# Patient Record
Sex: Male | Born: 1970 | Race: Black or African American | Hispanic: No | Marital: Single | Smoking: Never smoker
Health system: Southern US, Community
[De-identification: ages and names within clinical notes are randomized; demographics above are authoritative.]

## PROBLEM LIST (undated history)

## (undated) DIAGNOSIS — I10 Essential (primary) hypertension: Secondary | ICD-10-CM

---

## 2005-07-02 ENCOUNTER — Emergency Department: Payer: Self-pay | Admitting: Unknown Physician Specialty

## 2010-04-28 ENCOUNTER — Emergency Department: Payer: Self-pay | Admitting: Emergency Medicine

## 2011-07-12 ENCOUNTER — Emergency Department: Payer: Self-pay | Admitting: Emergency Medicine

## 2012-10-31 ENCOUNTER — Emergency Department: Payer: Self-pay | Admitting: Unknown Physician Specialty

## 2017-12-04 ENCOUNTER — Encounter: Payer: Self-pay | Admitting: Emergency Medicine

## 2017-12-04 ENCOUNTER — Emergency Department
Admission: EM | Admit: 2017-12-04 | Discharge: 2017-12-04 | Disposition: A | Discharge status: Home / Self Care | Payer: BLUE CROSS/BLUE SHIELD | Attending: Student in an Organized Health Care Education/Training Program | Admitting: Student in an Organized Health Care Education/Training Program

## 2017-12-04 ENCOUNTER — Other Ambulatory Visit: Payer: Self-pay

## 2017-12-04 DIAGNOSIS — A749 Chlamydial infection, unspecified: Secondary | ICD-10-CM

## 2017-12-04 DIAGNOSIS — Z202 Contact with and (suspected) exposure to infections with a predominantly sexual mode of transmission: Secondary | ICD-10-CM | POA: Diagnosis present

## 2017-12-04 LAB — URINALYSIS, COMPLETE (UACMP) WITH MICROSCOPIC
Bacteria, UA: NONE SEEN
Bilirubin Urine: NEGATIVE
Glucose, UA: NEGATIVE mg/dL
Hgb urine dipstick: NEGATIVE
KETONES UR: NEGATIVE mg/dL
LEUKOCYTES UA: NEGATIVE
Nitrite: NEGATIVE
Protein, ur: NEGATIVE mg/dL
SPECIFIC GRAVITY, URINE: 1.011 (ref 1.005–1.030)
Squamous Epithelial / LPF: NONE SEEN (ref 0–5)
pH: 6 (ref 5.0–8.0)

## 2017-12-04 LAB — CHLAMYDIA/NGC RT PCR (ARMC ONLY)
Chlamydia Tr: DETECTED — AB
N gonorrhoeae: NOT DETECTED

## 2017-12-04 MED ORDER — CEFTRIAXONE SODIUM 250 MG IJ SOLR
250.0000 mg | Freq: Once | INTRAMUSCULAR | Status: AC
  Administered 2017-12-04: 250 mg via INTRAMUSCULAR
  Filled 2017-12-04: qty 250

## 2017-12-04 MED ORDER — AZITHROMYCIN 500 MG PO TABS
1000.0000 mg | ORAL_TABLET | Freq: Once | ORAL | Status: AC
  Administered 2017-12-04: 1000 mg via ORAL
  Filled 2017-12-04: qty 2

## 2017-12-04 MED ORDER — LIDOCAINE HCL (PF) 1 % IJ SOLN
5.0000 mL | Freq: Once | INTRAMUSCULAR | Status: AC
  Administered 2017-12-04: 5 mL
  Filled 2017-12-04: qty 5

## 2017-12-04 NOTE — Discharge Instructions (Addendum)
Advised to notify sexual contact of chlamydia infection.

## 2017-12-04 NOTE — ED Notes (Signed)
See triage note  Presents with possible STD  States he noticed some penile discharge couple of days ago  Denies any other sx's

## 2017-12-04 NOTE — ED Triage Notes (Signed)
Patient ambulatory to triage with steady gait, without difficulty or distress noted; pt reports possible STD

## 2017-12-04 NOTE — ED Provider Notes (Signed)
[facility] Emergency Department Provider Note   ____________________________________________   First MD Initiated Contact with Patient 12/04/17 0710     (approximate)  I have reviewed the triage vital signs and the nursing notes.   HISTORY  Chief Complaint Exposure to STD    HPI Aaron Richard is a 47 y.o. male versus concern for STD exposure.  Patient denies any symptoms.  Patient last sexual contact was 1 week ago.   History reviewed. No pertinent past medical history.  There are no active problems to display for this patient.   History reviewed. No pertinent surgical history.  Prior to Admission medications   Not on File    Allergies Patient has no known allergies.  No family history on file.  Social History Social History   Tobacco Use  . Smoking status: Never Smoker  . Smokeless tobacco: Never Used  Substance Use Topics  . Alcohol use: Not on file  . Drug use: Not on file    Review of Systems Constitutional: No fever/chills Eyes: No visual changes. ENT: No sore throat. Cardiovascular: Denies chest pain. Respiratory: Denies shortness of breath. Gastrointestinal: No abdominal pain.  No nausea, no vomiting.  No diarrhea.  No constipation. Genitourinary: Negative for dysuria. Musculoskeletal: Negative for back pain. Skin: Negative for rash. Neurological: Negative for headaches, focal weakness or numbness.   ____________________________________________   PHYSICAL EXAM:  VITAL SIGNS: ED Triage Vitals  Enc Vitals Group     BP 12/04/17 0541 (!) 148/102     Pulse Rate 12/04/17 0541 (!) 102     Resp 12/04/17 0541 18     Temp 12/04/17 0541 98.1 F (36.7 C)     Temp Source 12/04/17 0541 Oral     SpO2 12/04/17 0541 98 %     Weight 12/04/17 0534 216 lb (98 kg)     Height 12/04/17 0534  (1.676 m)     Head Circumference --      Peak Flow --      Pain Score 12/04/17 0532 0     Pain Loc --      Pain Edu? --    Excl. in GC? --     Constitutional: Alert and oriented. Well appearing and in no acute distress. Cardiovascular: Normal rate, regular rhythm. Grossly normal heart sounds.  Good peripheral circulation. Respiratory: Normal respiratory effort.  No retractions. Lungs CTAB. Gastrointestinal: Soft and nontender. No distention. No abdominal bruits. No CVA tenderness. Genitourinary: Positive urethral discharge or lesions. Skin:  Skin is warm, dry and intact. No rash noted. Psychiatric: Mood and affect are normal. Speech and behavior are normal.  ____________________________________________   LABS (all labs ordered are listed, but only abnormal results are displayed)  Labs Reviewed  CHLAMYDIA/NGC RT PCR ([facility] ONLY) - Abnormal; Notable for the following components:      Result Value   Chlamydia Tr DETECTED (*)    All other components within normal limits  URINALYSIS, COMPLETE (UACMP) WITH MICROSCOPIC - Abnormal; Notable for the following components:   Color, Urine STRAW (*)    APPearance CLEAR (*)    All other components within normal limits   ____________________________________________  EKG   ____________________________________________  RADIOLOGY  ED MD interpretation:    Official radiology report(s): No results found.  ____________________________________________   PROCEDURES  Procedure(s) performed:   Procedures  Critical Care performed:   ____________________________________________   INITIAL IMPRESSION / ASSESSMENT AND PLAN / ED COURSE  As part of my medical decision making,  I reviewed the following data within the electronic MEDICAL RECORD NUMBER    Patient was positive for chlamydia.  Patient was treated prior to departure with Zithromax and Rocephin.  Patient advised to follow-up with the [facility] department and notify sexual partner of diagnosis.      ____________________________________________   FINAL CLINICAL IMPRESSION(S) /  ED DIAGNOSES  Final diagnoses:  Chlamydia infection     ED Discharge Orders    None       Note:  This document was prepared using Dragon voice recognition software and may include unintentional dictation errors.    Joni Reining, PA-C 12/04/17 0756    Pershing Proud Myra Rude, MD 12/10/17 2112

## 2018-07-14 ENCOUNTER — Encounter: Payer: Self-pay | Admitting: Emergency Medicine

## 2018-07-14 ENCOUNTER — Emergency Department
Admission: EM | Admit: 2018-07-14 | Discharge: 2018-07-14 | Disposition: A | Discharge status: Home / Self Care | Payer: BLUE CROSS/BLUE SHIELD | Attending: Emergency Medicine | Admitting: Emergency Medicine

## 2018-07-14 ENCOUNTER — Emergency Department: Payer: BLUE CROSS/BLUE SHIELD

## 2018-07-14 ENCOUNTER — Other Ambulatory Visit: Payer: Self-pay

## 2018-07-14 DIAGNOSIS — I10 Essential (primary) hypertension: Secondary | ICD-10-CM | POA: Diagnosis not present

## 2018-07-14 DIAGNOSIS — M25531 Pain in right wrist: Secondary | ICD-10-CM | POA: Diagnosis present

## 2018-07-14 DIAGNOSIS — M778 Other enthesopathies, not elsewhere classified: Secondary | ICD-10-CM | POA: Diagnosis not present

## 2018-07-14 HISTORY — DX: Essential (primary) hypertension: I10

## 2018-07-14 MED ORDER — PREDNISONE 10 MG PO TABS: ORAL_TABLET | ORAL | 0 refills | Status: DC

## 2018-07-14 MED ORDER — KETOROLAC TROMETHAMINE 30 MG/ML IJ SOLN
30.0000 mg | Freq: Once | INTRAMUSCULAR | Status: AC
  Administered 2018-07-14: 30 mg via INTRAMUSCULAR
  Filled 2018-07-14: qty 1

## 2018-07-14 NOTE — Discharge Instructions (Addendum)
Follow-up with your primary care provider or Dr. Joice Lofts who is on-call for orthopedics if any continued problems or not improving.  Begin taking the prednisone tonight as directed tapering down 1 tablet every 2 days.  This medication will not cause any drowsiness and can be taken while you are driving.  You may apply ice to your wrist as needed.  Wear the wrist splint for added support and protection. Also take your blood pressure medication every day as it was elevated in the emergency department.  Your initial blood pressure was 185/109.

## 2018-07-14 NOTE — ED Triage Notes (Signed)
Pt to ED via POV c/o right wrist pain. Pt states that he has had pain since Friday. Denies any known injury. Pt is in NAD at this time.

## 2018-07-14 NOTE — ED Provider Notes (Signed)
[facility] Emergency Department Provider Note   ____________________________________________   First MD Initiated Contact with Patient 07/14/18 1407     (approximate)  I have reviewed the triage vital signs and the nursing notes.   HISTORY  Chief Complaint Wrist Pain   HPI Aaron Richard is a 48 y.o. male presents to the ED with complaint of right wrist pain.  Patient states it began several days ago.  He denies any injury to his wrist.  He states that he is taken ibuprofen which helped.  He states that he range of motion of his wrist increases his pain.  He denies any past wrist fractures.  Patient is right-handed.  Currently rates his pain as 9 out of 10.  Past Medical History:  Diagnosis Date  . Hypertension     There are no active problems to display for this patient.   History reviewed. No pertinent surgical history.  Prior to Admission medications   Medication Sig Start Date End Date Taking? Authorizing Provider  predniSONE (DELTASONE) 10 MG tablet Take 4 tabs x 2 days, 3 tabs x 2 days, 2 tabs x 2 days and 1 tab x 2 days 07/14/18   Tommi Rumps, PA-C    Allergies Patient has no known allergies.  No family history on file.  Social History Social History   Tobacco Use  . Smoking status: Never Smoker  . Smokeless tobacco: Never Used  Substance Use Topics  . Alcohol use: Yes  . Drug use: Not Currently    Review of Systems Constitutional: No fever/chills Cardiovascular: Denies chest pain. Respiratory: Denies shortness of breath. Gastrointestinal: No abdominal pain.  No nausea, no vomiting.  Musculoskeletal: Positive for right wrist pain. Skin: Negative for rash. Neurological: Negative for headaches, focal weakness or numbness. ___________________________________________   PHYSICAL EXAM:  VITAL SIGNS: ED Triage Vitals  Enc Vitals Group     BP 07/14/18 1305 (!) 185/109     Pulse Rate 07/14/18 1305 86     Resp 07/14/18  1305 16     Temp 07/14/18 1305 97.8 F (36.6 C)     Temp Source 07/14/18 1305 Oral     SpO2 07/14/18 1305 100 %     Weight 07/14/18 1304 216 lb (98 kg)     Height 07/14/18 1304 5\' 7"  (1.702 m)     Head Circumference --      Peak Flow --      Pain Score 07/14/18 1304 9     Pain Loc --      Pain Edu? --      Excl. in GC? --    Constitutional: Alert and oriented. Well appearing and in no acute distress. Eyes: Conjunctivae are normal.  Head: Atraumatic. Neck: No stridor.   Cardiovascular: Normal rate, regular rhythm. Grossly normal heart sounds.  Good peripheral circulation. Respiratory: Normal respiratory effort.  No retractions. Lungs CTAB. Musculoskeletal: Right wrist without deformity.   Patient is able to flex and extend without restriction.  There is moderate tenderness on palpation of both the distal radius and ulnar.  No soft tissue swelling is appreciated.  No abrasions or discoloration is seen.  Pulses present.  Motor sensory function intact distally and capillary refills less than 3 seconds.  No point tenderness is on palpation of the lateral epicondyle.  Pain is increased with patient making a fist. Neurologic:  Normal speech and language. No gross focal neurologic deficits are appreciated. No gait instability. Skin:  Skin is warm,  dry and intact. No rash noted. Psychiatric: Mood and affect are normal. Speech and behavior are normal.  ____________________________________________   LABS (all labs ordered are listed, but only abnormal results are displayed)  Labs Reviewed - No data to display  RADIOLOGY   Official radiology report(s): Dg Wrist Complete Right  Result Date: 07/14/2018 CLINICAL DATA:  Pain and swelling over the last 3 days. No known injury. EXAM: RIGHT WRIST - COMPLETE 3+ VIEW COMPARISON:  None. FINDINGS: There is no evidence of fracture or dislocation. There is no evidence of arthropathy or other focal bone abnormality. Soft tissues are unremarkable.  IMPRESSION: Negative. Electronically Signed   By: Paulina Fusi M.D.   On: 07/14/2018 14:26    ____________________________________________   PROCEDURES  Procedure(s) performed: Prefabricated cock-up wrist splint was applied by the tech to the right wrist.  Procedures  Critical Care performed: No  ____________________________________________   INITIAL IMPRESSION / ASSESSMENT AND PLAN / ED COURSE  As part of my medical decision making, I reviewed the following data within the electronic MEDICAL RECORD NUMBER Notes from prior ED visits and  Controlled Substance Database  48 year old male presents to the ED with complaint of right wrist pain for several days without history of injury.  He has taken ibuprofen with short duration of pain improvement.  X-ray was negative.  Physical exam is more suspicious for a tendinitis of the right wrist.  Patient was placed in a cock-up wrist splint.  He is also given Toradol 30 mg IM.  His blood pressure was rechecked and he states that he has not taken his blood pressure medicine and several days.  He is encouraged to take it daily.  He was discharged with prescription for prednisone tapering dose.  He is to follow-up with his PCP or Dr. Joice Lofts if any continued problems with his wrist.  ____________________________________________   FINAL CLINICAL IMPRESSION(S) / ED DIAGNOSES  Final diagnoses:  Right wrist tendonitis     ED Discharge Orders         Ordered    predniSONE (DELTASONE) 10 MG tablet     07/14/18 1509           Note:  This document was prepared using Dragon voice recognition software and may include unintentional dictation errors.    Tommi Rumps, PA-C 07/14/18 1604    Minna Antis, MD 07/15/18 1428

## 2019-08-15 ENCOUNTER — Ambulatory Visit (INDEPENDENT_AMBULATORY_CARE_PROVIDER_SITE_OTHER): Payer: BC Managed Care – PPO | Admitting: Urology

## 2019-08-15 ENCOUNTER — Other Ambulatory Visit: Payer: Self-pay

## 2019-08-15 ENCOUNTER — Encounter: Payer: Self-pay | Admitting: Urology

## 2019-08-15 VITALS — BP 172/106 | HR 90 | Wt 209.9 lb

## 2019-08-15 DIAGNOSIS — Z3009 Encounter for other general counseling and advice on contraception: Secondary | ICD-10-CM

## 2019-08-15 MED ORDER — DIAZEPAM 10 MG PO TABS: 10.0000 mg | ORAL_TABLET | Freq: Once | ORAL | 0 refills | Status: AC

## 2019-08-15 NOTE — Progress Notes (Signed)
08/15/2019 10:03 AM   Mali L Vold 05-02-1971 [id number]  Referring provider: No referring provider defined for this encounter.  Chief Complaint  Patient presents with  . VAS Consult    HPI:  Mali is considering vasectomy. He is single. Doesn't want to have anymore. His kids are 50 yo, 60 yo and 20 yo. He has grand kids. He has considered it for a few years. He has tried condoms. Discussed vasectomy will not prevent STI. No inguinal or testicle surgery. He does [employer].     PMH: Past Medical History:  Diagnosis Date  . Hypertension     Surgical History: No past surgical history on file.  Home Medications:  Allergies as of 08/15/2019   No Known Allergies     Medication List       Accurate as of August 15, 2019 10:03 AM. If you have any questions, ask your nurse or doctor.        STOP taking these medications   predniSONE 10 MG tablet Commonly known as: DELTASONE Stopped by: Festus Aloe, MD       Allergies: No Known Allergies  Family History: No family history on file.  Social History:  reports that he has never smoked. He has never used smokeless tobacco. He reports current alcohol use. He reports previous drug use.  ROS: UROLOGY Frequent Urination?: No Hard to postpone urination?: No Burning/pain with urination?: No Get up at night to urinate?: No Leakage of urine?: No Urine stream starts and stops?: No Trouble starting stream?: No Do you have to strain to urinate?: No Blood in urine?: No Urinary tract infection?: No Sexually transmitted disease?: No Injury to kidneys or bladder?: No Painful intercourse?: No Weak stream?: No Erection problems?: No Penile pain?: No  Gastrointestinal Nausea?: No Vomiting?: No Indigestion/heartburn?: No Diarrhea?: No Constipation?: No  Constitutional Fever: No Night sweats?: No Weight loss?: No Fatigue?: No  Skin Skin rash/lesions?: No Itching?: No  Eyes Blurred vision?: No Double  vision?: No  Ears/Nose/Throat Sore throat?: No Sinus problems?: No  Hematologic/Lymphatic Swollen glands?: No Easy bruising?: No  Cardiovascular Leg swelling?: No Chest pain?: No  Respiratory Cough?: No Shortness of breath?: No  Endocrine Excessive thirst?: No  Musculoskeletal Back pain?: No Joint pain?: No  Neurological Headaches?: No Dizziness?: No  Psychologic Depression?: No Anxiety?: No  Physical Exam: BP (!) 172/106 (BP Location: Left Arm, Patient Position: Sitting, Cuff Size: Large)   Pulse 90   Wt 209 lb 14.4 oz (95.2 kg)   BMI 32.87 kg/m   Constitutional:  Alert and oriented, No acute distress. HEENT: [employer] AT, moist mucus membranes.  Trachea midline, no masses. Cardiovascular: No clubbing, cyanosis, or edema. Respiratory: Normal respiratory effort, no increased work of breathing. GI: Abdomen is soft, nontender, nondistended, no abdominal masses GU: No CVA tenderness GU: GU: Penis circumcised, normal foreskin, testicles descended bilaterally and palpably normal, bilateral epididymis palpably normal, scrotum normal Lymph: No cervical or inguinal lymphadenopathy. Skin: No rashes, bruises or suspicious lesions. Neurologic: Grossly intact, no focal deficits, moving all 4 extremities. Psychiatric: Normal mood and affect.  Laboratory Data: No results found for: WBC, HGB, HCT, MCV, PLT  No results found for: CREATININE  No results found for: PSA  No results found for: TESTOSTERONE  No results found for: HGBA1C  Urinalysis    Component Value Date/Time   COLORURINE STRAW (A) 12/04/2017 0548   APPEARANCEUR CLEAR (A) 12/04/2017 0548   LABSPEC 1.011 12/04/2017 0548   PHURINE 6.0 12/04/2017 8099  GLUCOSEU NEGATIVE 12/04/2017 0548   HGBUR NEGATIVE 12/04/2017 0548   BILIRUBINUR NEGATIVE 12/04/2017 0548   KETONESUR NEGATIVE 12/04/2017 0548   PROTEINUR NEGATIVE 12/04/2017 0548   NITRITE NEGATIVE 12/04/2017 0548   LEUKOCYTESUR NEGATIVE 12/04/2017 0548      Lab Results  Component Value Date   BACTERIA NONE SEEN 12/04/2017    Pertinent Imaging: n/a No results found for this or any previous visit. No results found for this or any previous visit. No results found for this or any previous visit. No results found for this or any previous visit. No results found for this or any previous visit. No results found for this or any previous visit. No results found for this or any previous visit. No results found for this or any previous visit.  Assessment & Plan:    Vasectomy counseling - I discussed with the patient normal male genitourinary anatomy and passage of sperm. We discussed the nature of vasectomy and that vasectomy is intended to be a permanent form of contraception. Although options do exist for fertility after vasectomy they are not always successful and can be quite expensive. We discussed vasectomy does not produce immediate sterility and another  form of contraception is required until a postvasectomy semen analysis confirms no sperm. We discussed this can take several weeks to months. We discussed that vasectomy is not 100% successful with the risk of pregnancy approximately 1 in 2000 following the procedure which may be due to late failure from rejoining of the vas ends.  Rarely repeat vasectomy is required. We discussed risks such as bleeding, infection, testicular atrophy, sperm granuloma, acute pain and permanent chronic scrotal pain among others. We discussed there are other permanent and nonpermanent alternatives to vasectomy including male sterilization. We discussed postop care and that no heavy lifting, ejaculation or strenuous exercise is recommended for one week. All questions answered. Again reminded of safe sex with condoms.    No follow-ups on file.  Jerilee Field, MD  [facility] [address] [place], [place] [address] [phone]

## 2019-08-15 NOTE — Patient Instructions (Signed)
Pre-Vasectomy Instructions  STOP all aspirin or blood thinners (Aspirin, Plavix, Coumadin, Warfarin, Motrin, Ibuprofen, Advil, Aleve, Naproxen, Naprosyn) for 7 days prior to the procedure.  If you have any questions about stopping these medications please contact your primary care physician or cardiologist.  Shave all hair from the upper scrotum on the day of the procedure.  This means just under the penis onto the scrotal sac.  The area shaved should measure about 2-3 inches around.  You may lather the scrotum with soap and water, and shave with a safety razor.  After shaving the area, thoroughly wash the penis and the scrotum, then shower or bathe to remove all the loose hairs.  If needed, wash the area again just before coming in for your circumcision.  It is recommended to have a light meal an hour or so prior to the procedure.  Bring a scrotal support (jock strap or suspensory, or tight jockey shorts or underwear).  Wear comfortable pants or shorts.  While the actual procedure usually takes about 45 minutes, you should be prepared to stay in the office for approximately one hour.  Bring someone with you to drive you home.  If you have any questions or concerns, please feel free to call the office at [phone].    Vasectomy Vasectomy is a procedure in which the tube that carries sperm from the testicle to the urethra (vas deferens) is tied. It may also be cut. The procedure blocks sperm from going through the vas deferens and penis during ejaculation. This ensures that sperm does not go into the vagina during sex. Vasectomy does not affect your sexual desire or performance, and does not prevent sexually transmitted diseases. Vasectomy is considered a permanent and very effective form of birth control (contraception). The decision to have a vasectomy should not be made during a stressful situation, such as after the loss of a pregnancy or a divorce. You and your partner should make the  decision to have a vasectomy when you are sure that you do not want children in the future. Tell a health care provider about:  Any allergies you have.  All medicines you are taking, including vitamins, herbs, eye drops, creams, and over-the-counter medicines.  Any problems you or family members have had with anesthetic medicines.  Any blood disorders you have.  Any surgeries you have had.  Any medical conditions you have. What are the risks? Generally, this is a safe procedure. However, problems may occur, including:  Infection.  Bleeding and swelling of the scrotum.  Allergic reactions to medicines.  Failure of the procedure to prevent pregnancy. There is a very small chance that the cut ends of the vas deferens may reconnect (recanalization), meaning that you could still make a woman pregnant.  Pain in the scrotum that continues after healing from the procedure. What happens before the procedure?  Ask your health care provider about: ? Changing or stopping your regular medicines. This is especially important if you are taking diabetes medicines or blood thinners. ? Taking over-the-counter medicines, vitamins, herbs, and supplements. ? Taking medicines such as aspirin and ibuprofen. These medicines can thin your blood. Do not take these medicines unless your health care provider tells you to take them.  You may be asked to shower with a germ-killing soap.  Plan to have someone take you home from the hospital or clinic. What happens during the procedure?   To lower your risk of infection: ? Your health care team will wash or   sanitize their hands. ? Hair may be removed from the surgical area. ? Your scrotum will be washed with soap.  You will be given one or more of the following: ? A medicine to help you relax (sedative). You may be instructed to take this a few hours before the procedure. ? A medicine to numb the area (local anesthetic).  Your health care provider  will feel (palpate) for your vas deferens.  To reach the vas deferens, one of two methods may be used: ? A very small incision may be made in your scrotum. ? A punctured opening may be made in your scrotum, without an incision.  Your vas deferens will be pulled out of your scrotum, and may be: ? Tied off. ? Cut and possibly burned (cauterized) at the ends to seal them off.  The vas deferens will be put back into your scrotum.  The incision or puncture opening will be closed with absorbable stitches (sutures). The sutures will eventually dissolve and will not need to be removed after the procedure. The procedure may vary among health care providers and hospitals. What happens after the procedure?  You will be monitored to make sure that you do not experience problems.  You will be asked not to ejaculate for at least 1 week after the procedure, or as long as directed.  You will need to use a different form of contraception for 2-4 months after the procedure, until you have test results confirming that there are no sperm in your semen.  You may be given scrotal support to wear, such as a jock strap or underwear with a supportive pouch.  Do not drive for 24 hours if you were given a sedative to help you relax. Summary  Vasectomy is considered a permanent and very effective form of birth control (contraception). The procedure prevents sperm from being released during ejaculation.  Your scrotum will be numbed with medicine (local anesthetic) for the procedure.  After the procedure, you will be asked not to ejaculate for at least 1 week, or for as long as directed. You will also need to use a different form of contraception until your health care provider examines you and finds that there are no sperm in your semen. This information is not intended to replace advice given to you by your health care provider. Make sure you discuss any questions you have with your health care  provider. Document Revised: 12/28/2017 Document Reviewed: 09/22/2016 Elsevier Patient Education  2020 [employer]  

## 2019-09-26 ENCOUNTER — Encounter: Payer: Self-pay | Admitting: Urology

## 2020-12-02 ENCOUNTER — Encounter: Payer: Self-pay | Admitting: Emergency Medicine

## 2020-12-02 ENCOUNTER — Other Ambulatory Visit: Payer: Self-pay

## 2020-12-02 ENCOUNTER — Emergency Department
Admission: EM | Admit: 2020-12-02 | Discharge: 2020-12-02 | Disposition: A | Discharge status: Home / Self Care | Payer: BC Managed Care – PPO | Attending: Emergency Medicine | Admitting: Emergency Medicine

## 2020-12-02 ENCOUNTER — Emergency Department: Payer: BC Managed Care – PPO

## 2020-12-02 DIAGNOSIS — M25572 Pain in left ankle and joints of left foot: Secondary | ICD-10-CM

## 2020-12-02 DIAGNOSIS — I10 Essential (primary) hypertension: Secondary | ICD-10-CM | POA: Diagnosis not present

## 2020-12-02 DIAGNOSIS — Z79899 Other long term (current) drug therapy: Secondary | ICD-10-CM | POA: Diagnosis not present

## 2020-12-02 MED ORDER — INDOMETHACIN 25 MG PO CAPS: 25.0000 mg | ORAL_CAPSULE | Freq: Three times a day (TID) | ORAL | 0 refills | Status: AC

## 2020-12-02 MED ORDER — KETOROLAC TROMETHAMINE 30 MG/ML IJ SOLN
30.0000 mg | Freq: Once | INTRAMUSCULAR | Status: AC
  Administered 2020-12-02: 30 mg via INTRAMUSCULAR
  Filled 2020-12-02: qty 1

## 2020-12-02 MED ORDER — AMLODIPINE BESYLATE 10 MG PO TABS: 10.0000 mg | ORAL_TABLET | Freq: Every day | ORAL | 2 refills | Status: DC

## 2020-12-02 NOTE — ED Notes (Signed)
See triage note  Presents with left ankle pain this am  Denies any injury  Min swelling noted  Good pulses unable to bear full wt

## 2020-12-02 NOTE — Discharge Instructions (Signed)
Call today to get an appointment with a doctor at either [facility], [facility] or [place] [facility].  Their phone numbers and addresses are listed on your discharge papers.  With your hypertension you will definitely need to get a primary care provider and get your blood pressure under control.  Blood pressure is the #1 reason for sudden death due to stroke or heart attack.  Your initial blood pressure is 184/105.  An ideal blood pressure would be 120/70.  A prescription for blood pressure medication was sent to your pharmacy to begin taking today.  There is also 2 refills which gives you 90 days to find a primary care provider.  You may also go to an urgent care until you are able to find a primary care provider.  You were given an injection of Toradol to help with your ankle pain.  There is a high chance that this could be gout as your x-rays were negative for any acute bony injury.  A prescription for indomethacin was sent to your pharmacy.  This is to be taken with food.  Do not take on empty stomach.  If you develop any stomach pain or see black tarry stools discontinue taking the indomethacin immediately.

## 2020-12-02 NOTE — ED Triage Notes (Signed)
L ankle pain since Tuesday morning, denies injury, took OTC meds without relief. Pt reports swelling and painful to bear weight on the L foot.

## 2020-12-02 NOTE — ED Provider Notes (Signed)
[facility] Emergency Department Provider Note  ____________________________________________   Event Date/Time   First MD Initiated Contact with Patient 12/02/20 0725     (approximate)  I have reviewed the triage vital signs and the nursing notes.   HISTORY  Chief Complaint Ankle Pain   HPI Aaron Richard is a 50 y.o. male presents to the ED with complaint of left ankle pain that began 2 days ago.  Patient denies any known injury but states that it is now painful and swollen.  Pain is increased with weightbearing.  He denies any previous ankle problems and gout.  Patient also is aware that his blood pressure was elevated in the emergency department.  He states that he has not been on blood pressure medication for "long time".  Patient currently is looking for PCP.  He denies any chest pain, dizziness, headache with his hypertension.      Past Medical History:  Diagnosis Date  . Hypertension     There are no problems to display for this patient.   History reviewed. No pertinent surgical history.  Prior to Admission medications   Medication Sig Start Date End Date Taking? Authorizing Provider  amLODipine (NORVASC) 10 MG tablet Take 1 tablet (10 mg total) by mouth daily. 12/02/20 12/02/21 Yes Jaqualyn Juday L, PA-C  indomethacin (INDOCIN) 25 MG capsule Take 1 capsule (25 mg total) by mouth 3 (three) times daily with meals for 5 days. 12/02/20 12/07/20 Yes Tommi Rumps, PA-C    Allergies Patient has no known allergies.  No family history on file.  Social History Social History   Tobacco Use  . Smoking status: Never Smoker  . Smokeless tobacco: Never Used  Vaping Use  . Vaping Use: Never used  Substance Use Topics  . Alcohol use: Yes  . Drug use: Not Currently    Review of Systems Constitutional: No fever/chills Eyes: No visual changes. Cardiovascular: Denies chest pain. Respiratory: Denies shortness of breath. Gastrointestinal: No  abdominal pain.  No nausea, no vomiting.  Genitourinary: Negative for dysuria. Musculoskeletal: Positive for left ankle pain. Skin: Negative for rash. Neurological: Negative for headaches, focal weakness or numbness. ____________________________________________   PHYSICAL EXAM:  VITAL SIGNS: ED Triage Vitals  Enc Vitals Group     BP 12/02/20 0516 (S) (!) 191/104     Pulse Rate 12/02/20 0516 (!) 126     Resp 12/02/20 0516 18     Temp 12/02/20 0516 98.8 F (37.1 C)     Temp Source 12/02/20 0516 Oral     SpO2 12/02/20 0516 99 %     Weight 12/02/20 0517 216 lb (98 kg)     Height 12/02/20 0517 5\' 7"  (1.702 m)     Head Circumference --      Peak Flow --      Pain Score 12/02/20 0517 8     Pain Loc --      Pain Edu? --      Excl. in GC? --     Constitutional: Alert and oriented. Well appearing and in no acute distress. Eyes: Conjunctivae are normal.  Head: Atraumatic. Nose: No congestion/rhinnorhea. Neck: No stridor.   Cardiovascular: Normal rate, regular rhythm. Grossly normal heart sounds.  Good peripheral circulation. Respiratory: Normal respiratory effort.  No retractions. Lungs CTAB. Musculoskeletal: On examination of the left ankle there is no gross deformity however the ankle is warm to touch.  No erythema or discoloration is noted.  Skin is intact.  Dorsalis pedis  is present.  Motor sensory function intact distally.  Range of motion is without restriction. Neurologic:  Normal speech and language. No gross focal neurologic deficits are appreciated. No gait instability. Skin:  Skin is warm, dry and intact. No rash noted. Psychiatric: Mood and affect are normal. Speech and behavior are normal.  ____________________________________________   LABS (all labs ordered are listed, but only abnormal results are displayed)  Labs Reviewed - No data to display ____________________________________________   RADIOLOGY I, Tommi Rumps, personally viewed and evaluated these  images (plain radiographs) as part of my medical decision making, as well as reviewing the written report by the radiologist.   Official radiology report(s): DG Ankle Complete Left  Result Date: 12/02/2020 CLINICAL DATA:  Left ankle pain since Tuesday morning, atraumatic EXAM: LEFT ANKLE COMPLETE - 3+ VIEW COMPARISON:  None. FINDINGS: Soft tissue swelling without fracture, subluxation, or erosion. Ossicles below both medial and lateral malleoli. Mild spurring at the plantar heel and tibiotalar joint. Borderline for ankle joint effusion. No erosion or soft tissue calcification. IMPRESSION: Soft tissue swelling without acute osseous finding. Electronically Signed   By: Marnee Spring M.D.   On: 12/02/2020 05:40    ____________________________________________   PROCEDURES  Procedure(s) performed (including Critical Care):  Procedures   ____________________________________________   INITIAL IMPRESSION / ASSESSMENT AND PLAN / ED COURSE  As part of my medical decision making, I reviewed the following data within the electronic MEDICAL RECORD NUMBER Notes from prior ED visits and [place] Controlled Substance Database  50 year old male presents to the ED with complaint of left ankle pain that began this morning.  Patient denies any injury.  There is minimal soft tissue edema but warmth is noted in comparison with the right ankle.  X-rays were negative for acute bony injury.  Patient states that members of his family have gout but he has never told so.  Currently his blood pressure is elevated and patient states that although he has been living in [place] for 4 years he has not found a PCP.  His blood pressure today is elevated patient was made aware.  He does have a history of hypertension but does not recall the name of the medicine that he has taken in the past.  Blood pressure initially was 191/104 and at that time discharge was 184/105.  Patient denied any headache, dizziness, shortness of breath,  chest pain associated with his hypertension.  Patient was given a prescription for amlodipine 10 mg 1 daily with 1 refill.  Patient is strongly advised to obtain a primary care provider.  A list of clinics was given to him to begin calling.  Toradol 30 mg IM was given while in the ED and a prescription for indomethacin 25 mg 3 times daily with food was sent to his pharmacy.  Patient is aware that he should discontinue taking this medication immediately if he develops any stomach discomfort.  ____________________________________________   FINAL CLINICAL IMPRESSION(S) / ED DIAGNOSES  Final diagnoses:  Acute left ankle pain  Hypertension, unspecified type     ED Discharge Orders         Ordered    indomethacin (INDOCIN) 25 MG capsule  3 times daily with meals        12/02/20 0813    amLODipine (NORVASC) 10 MG tablet  Daily        12/02/20 0813          *Please note:  Aaron L Rorrer was evaluated in Emergency Department on 12/02/2020  for the symptoms described in the history of present illness. He was evaluated in the context of the global COVID-19 pandemic, which necessitated consideration that the patient might be at risk for infection with the SARS-CoV-2 virus that causes COVID-19. Institutional protocols and algorithms that pertain to the evaluation of patients at risk for COVID-19 are in a state of rapid change based on information released by regulatory bodies including the CDC and federal and state organizations. These policies and algorithms were followed during the patient's care in the ED.  Some ED evaluations and interventions may be delayed as a result of limited staffing during and the pandemic.*   Note:  This document was prepared using Dragon voice recognition software and may include unintentional dictation errors.    Tommi Rumps, PA-C 12/02/20 1514    Minna Antis, MD 12/02/20 1515

## 2021-01-24 ENCOUNTER — Ambulatory Visit (INDEPENDENT_AMBULATORY_CARE_PROVIDER_SITE_OTHER): Payer: 59 | Admitting: Nurse Practitioner

## 2021-01-24 ENCOUNTER — Encounter: Payer: Self-pay | Admitting: Nurse Practitioner

## 2021-01-24 ENCOUNTER — Other Ambulatory Visit: Payer: Self-pay

## 2021-01-24 VITALS — BP 172/99 | HR 100 | Temp 98.3°F | Ht 67.17 in | Wt 209.2 lb

## 2021-01-24 DIAGNOSIS — Z7689 Persons encountering health services in other specified circumstances: Secondary | ICD-10-CM | POA: Diagnosis not present

## 2021-01-24 DIAGNOSIS — M109 Gout, unspecified: Secondary | ICD-10-CM | POA: Diagnosis not present

## 2021-01-24 DIAGNOSIS — I10 Essential (primary) hypertension: Secondary | ICD-10-CM | POA: Diagnosis not present

## 2021-01-24 MED ORDER — VALSARTAN-HYDROCHLOROTHIAZIDE 80-12.5 MG PO TABS: 1.0000 | ORAL_TABLET | Freq: Every day | ORAL | 0 refills | Status: DC

## 2021-01-24 NOTE — Progress Notes (Signed)
BP (!) 172/99   Pulse 100   Temp 98.3 F (36.8 C)   Ht 5' 7.17" (1.706 m)   Wt 209 lb 4 oz (94.9 kg)   SpO2 98%   BMI 32.61 kg/m    Subjective:    Patient ID: Aaron Richard, male    DOB: 03/01/1971, 50 y.o.   MRN: [id number]  HPI: Aaron L Allan is a 50 y.o. male  Chief Complaint  Patient presents with   Establish Care   Hypertension   Patient presents to clinic to establish care with new PCP.  Patient reports a history of HTN, prediabetic and has a history of gout about 1 month ago was his last attack.  HYPERTENSION Hypertension status: uncontrolled  Satisfied with current treatment? no Duration of hypertension: years BP monitoring frequency:  not checking BP range:  BP medication side effects:  no Medication compliance: excellent compliance Previous BP meds:amlodipine Aspirin: no Recurrent headaches: no Visual changes: no Palpitations: no Dyspnea: no Chest pain: no Lower extremity edema: no Dizzy/lightheaded: no   Patient denies a history of: Hypertension, Elevated Cholesterol, Thyroid problems, Depression, Anxiety, Neurological problems, and Abdominal problems.   Denies past surgical history.  Active Ambulatory Problems    Diagnosis Date Noted   Hypertension 01/24/2021   Gout 01/24/2021   Resolved Ambulatory Problems    Diagnosis Date Noted   No Resolved Ambulatory Problems   No Additional Past Medical History   History reviewed. No pertinent surgical history.  Family History  Problem Relation Age of Onset   Hypertension Mother      Review of Systems  Eyes:  Negative for visual disturbance.  Respiratory:  Negative for shortness of breath.   Cardiovascular:  Negative for chest pain and leg swelling.  Neurological:  Negative for light-headedness and headaches.   Per HPI unless specifically indicated above     Objective:    BP (!) 172/99   Pulse 100   Temp 98.3 F (36.8 C)   Ht 5' 7.17" (1.706 m)   Wt 209 lb 4 oz (94.9 kg)   SpO2 98%    BMI 32.61 kg/m   Wt Readings from Last 3 Encounters:  01/24/21 209 lb 4 oz (94.9 kg)  12/02/20 216 lb (98 kg)  08/15/19 209 lb 14.4 oz (95.2 kg)    Physical Exam Vitals and nursing note reviewed.  Constitutional:      General: He is not in acute distress.    Appearance: Normal appearance. He is not ill-appearing, toxic-appearing or diaphoretic.  HENT:     Head: Normocephalic.     Right Ear: External ear normal.     Left Ear: External ear normal.     Nose: Nose normal. No congestion or rhinorrhea.     Mouth/Throat:     Mouth: Mucous membranes are moist.  Eyes:     General:        Right eye: No discharge.        Left eye: No discharge.     Extraocular Movements: Extraocular movements intact.     Conjunctiva/sclera: Conjunctivae normal.     Pupils: Pupils are equal, round, and reactive to light.  Cardiovascular:     Rate and Rhythm: Normal rate and regular rhythm.     Heart sounds: No murmur heard. Pulmonary:     Effort: Pulmonary effort is normal. No respiratory distress.     Breath sounds: Normal breath sounds. No wheezing, rhonchi or rales.  Abdominal:     General: Abdomen  is flat. Bowel sounds are normal.  Musculoskeletal:     Cervical back: Normal range of motion and neck supple.  Skin:    General: Skin is warm and dry.     Capillary Refill: Capillary refill takes less than 2 seconds.  Neurological:     General: No focal deficit present.     Mental Status: He is alert and oriented to person, place, and time.  Psychiatric:        Mood and Affect: Mood normal.        Behavior: Behavior normal.        Thought Content: Thought content normal.        Judgment: Judgment normal.    Results for orders placed or performed during the hospital encounter of 12/04/17  Chlamydia/NGC rt PCR ([facility] only)   Specimen: Urine, Clean Catch  Result Value Ref Range   Specimen source GC/Chlam CHLAMYDIA SPECIES    Chlamydia Tr DETECTED (A) NOT DETECTED   N gonorrhoeae NOT DETECTED  NOT DETECTED  Urinalysis, Complete w Microscopic  Result Value Ref Range   Color, Urine STRAW (A) YELLOW   APPearance CLEAR (A) CLEAR   Specific Gravity, Urine 1.011 1.005 - 1.030   pH 6.0 5.0 - 8.0   Glucose, UA NEGATIVE NEGATIVE mg/dL   Hgb urine dipstick NEGATIVE NEGATIVE   Bilirubin Urine NEGATIVE NEGATIVE   Ketones, ur NEGATIVE NEGATIVE mg/dL   Protein, ur NEGATIVE NEGATIVE mg/dL   Nitrite NEGATIVE NEGATIVE   Leukocytes, UA NEGATIVE NEGATIVE   RBC / HPF 0-5 0 - 5 RBC/hpf   WBC, UA 11-20 0 - 5 WBC/hpf   Bacteria, UA NONE SEEN NONE SEEN   Squamous Epithelial / LPF NONE SEEN 0 - 5      Assessment & Plan:   Problem List Items Addressed This Visit       Cardiovascular and Mediastinum   Hypertension - Primary    Chronic. Not well controlled. Patient already on Amlodipine 10mg . Will add Valsartan 80mg  with HCTZ 12.5mg  to patient's regimen. Side effects and benefits discussed with patient during visit. Recommend checking blood pressures at home and brining log to next visit. Recommend a low salt diet.  Follow up in 3 weeks for reevaluation.        Relevant Medications   valsartan-hydrochlorothiazide (DIOVAN-HCT) 80-12.5 MG tablet     Other   Gout    Controlled at this time. Follow up if symptoms return.        Other Visit Diagnoses     Encounter to establish care            Follow up plan: Return in about 3 weeks (around 02/14/2021) for Physical and Fasting labs.

## 2021-01-24 NOTE — Assessment & Plan Note (Signed)
Controlled at this time. Follow up if symptoms return.

## 2021-01-24 NOTE — Assessment & Plan Note (Signed)
Chronic. Not well controlled. Patient already on Amlodipine 10mg . Will add Valsartan 80mg  with HCTZ 12.5mg  to patient's regimen. Side effects and benefits discussed with patient during visit. Recommend checking blood pressures at home and brining log to next visit. Recommend a low salt diet.  Follow up in 3 weeks for reevaluation.

## 2021-03-07 ENCOUNTER — Encounter: Payer: 59 | Admitting: Nurse Practitioner

## 2021-03-08 ENCOUNTER — Other Ambulatory Visit: Payer: Self-pay | Admitting: Nurse Practitioner

## 2021-03-08 NOTE — Telephone Encounter (Signed)
valsartan-hydrochlorothiazide (DIOVAN-HCT) 80-12.5 MG tablet

## 2021-03-09 MED ORDER — VALSARTAN-HYDROCHLOROTHIAZIDE 80-12.5 MG PO TABS: 1.0000 | ORAL_TABLET | Freq: Every day | ORAL | 0 refills | Status: DC

## 2021-03-09 NOTE — Telephone Encounter (Signed)
Requested medications are due for refill today.  yes  Requested medications are on the active medications list.  yes  Last refill. 01/24/2021  Future visit scheduled.   yes  Notes to clinic.  Patient needs labwork.

## 2021-03-09 NOTE — Telephone Encounter (Signed)
Patient last seen 01/24/21 and has appointment in September.

## 2021-03-22 NOTE — Progress Notes (Signed)
BP (!) 150/80 (BP Location: Right Arm, Cuff Size: Normal)   Pulse 90   Temp 98.5 F (36.9 C) (Oral)   Ht 5' 7.3" (1.709 m)   Wt 217 lb 9.6 oz (98.7 kg)   SpO2 99%   BMI 33.78 kg/m    Subjective:    Patient ID: Aaron Richard, male    DOB: 23-Nov-1970, 50 y.o.   MRN: [id number]  HPI: Aaron Richard is a 50 y.o. male presenting on 03/23/2021 for comprehensive medical examination. Current medical complaints include:none  He currently lives with: Interim Problems from his last visit: no  HYPERTENSION Hypertension status: controlled  Satisfied with current treatment? no Duration of hypertension: years BP monitoring frequency:  rarely BP range: "running high" BP medication side effects:  no Medication compliance: excellent compliance Previous BP meds: Valsartan/HCTZ Aspirin: no Recurrent headaches: no Visual changes: no Palpitations: no Dyspnea: no Chest pain: no Lower extremity edema: no Dizzy/lightheaded: no  Depression Screen done today and results listed below:  Depression screen [facility] 2/9 03/23/2021 01/24/2021  Decreased Interest 0 0  Down, Depressed, Hopeless 0 0  PHQ - 2 Score 0 0    The patient does not have a history of falls. I did complete a risk assessment for falls. A plan of care for falls was documented.   Past Medical History:  Past Medical History:  Diagnosis Date   Hypertension     Surgical History:  History reviewed. No pertinent surgical history.  Medications:  Current Outpatient Medications on File Prior to Visit  Medication Sig   amLODipine (NORVASC) 10 MG tablet Take 1 tablet (10 mg total) by mouth daily.   No current facility-administered medications on file prior to visit.    Allergies:  No Known Allergies  Social History:  Social History   Socioeconomic History   Marital status: Single    Spouse name: Not on file   Number of children: Not on file   Years of education: Not on file   Highest education level: Not on file   Occupational History   Not on file  Tobacco Use   Smoking status: Never   Smokeless tobacco: Never  Vaping Use   Vaping Use: Never used  Substance and Sexual Activity   Alcohol use: Yes    Comment: Occasionally   Drug use: Not Currently   Sexual activity: Yes    Birth control/protection: None  Other Topics Concern   Not on file  Social History Narrative   Not on file   Social Determinants of Health   Financial Resource Strain: Not on file  Food Insecurity: Not on file  Transportation Needs: Not on file  Physical Activity: Not on file  Stress: Not on file  Social Connections: Not on file  Intimate Partner Violence: Not on file   Social History   Tobacco Use  Smoking Status Never  Smokeless Tobacco Never   Social History   Substance and Sexual Activity  Alcohol Use Yes   Comment: Occasionally    Family History:  Family History  Problem Relation Age of Onset   Hypertension Mother     Past medical history, surgical history, medications, allergies, family history and social history reviewed with patient today and changes made to appropriate areas of the chart.   Review of Systems  Eyes:  Negative for blurred vision and double vision.  Respiratory:  Negative for shortness of breath.   Cardiovascular:  Negative for chest pain, palpitations and leg swelling.  Neurological:  Negative for dizziness and headaches.  All other ROS negative except what is listed above and in the HPI.      Objective:    BP (!) 150/80 (BP Location: Right Arm, Cuff Size: Normal)   Pulse 90   Temp 98.5 F (36.9 C) (Oral)   Ht 5' 7.3" (1.709 m)   Wt 217 lb 9.6 oz (98.7 kg)   SpO2 99%   BMI 33.78 kg/m   Wt Readings from Last 3 Encounters:  03/23/21 217 lb 9.6 oz (98.7 kg)  01/24/21 209 lb 4 oz (94.9 kg)  12/02/20 216 lb (98 kg)    Physical Exam Vitals and nursing note reviewed.  Constitutional:      General: He is not in acute distress.    Appearance: Normal appearance. He  is not ill-appearing, toxic-appearing or diaphoretic.  HENT:     Head: Normocephalic.     Right Ear: Tympanic membrane, ear canal and external ear normal.     Left Ear: Tympanic membrane, ear canal and external ear normal.     Nose: Nose normal. No congestion or rhinorrhea.     Mouth/Throat:     Mouth: Mucous membranes are moist.  Eyes:     General:        Right eye: No discharge.        Left eye: No discharge.     Extraocular Movements: Extraocular movements intact.     Conjunctiva/sclera: Conjunctivae normal.     Pupils: Pupils are equal, round, and reactive to light.  Cardiovascular:     Rate and Rhythm: Normal rate and regular rhythm.     Heart sounds: No murmur heard. Pulmonary:     Effort: Pulmonary effort is normal. No respiratory distress.     Breath sounds: Normal breath sounds. No wheezing, rhonchi or rales.  Abdominal:     General: Abdomen is flat. Bowel sounds are normal. There is no distension.     Palpations: Abdomen is soft.     Tenderness: There is no abdominal tenderness. There is no guarding.  Musculoskeletal:     Cervical back: Normal range of motion and neck supple.  Skin:    General: Skin is warm and dry.     Capillary Refill: Capillary refill takes less than 2 seconds.  Neurological:     General: No focal deficit present.     Mental Status: He is alert and oriented to person, place, and time.     Cranial Nerves: No cranial nerve deficit.     Motor: No weakness.     Deep Tendon Reflexes: Reflexes normal.  Psychiatric:        Mood and Affect: Mood normal.        Behavior: Behavior normal.        Thought Content: Thought content normal.        Judgment: Judgment normal.    Results for orders placed or performed during the hospital encounter of 12/04/17  Chlamydia/NGC rt PCR ([facility] only)   Specimen: Urine, Clean Catch  Result Value Ref Range   Specimen source GC/Chlam CHLAMYDIA SPECIES    Chlamydia Tr DETECTED (A) NOT DETECTED   N gonorrhoeae NOT  DETECTED NOT DETECTED  Urinalysis, Complete w Microscopic  Result Value Ref Range   Color, Urine STRAW (A) YELLOW   APPearance CLEAR (A) CLEAR   Specific Gravity, Urine 1.011 1.005 - 1.030   pH 6.0 5.0 - 8.0   Glucose, UA NEGATIVE NEGATIVE mg/dL   Hgb urine dipstick NEGATIVE NEGATIVE  Bilirubin Urine NEGATIVE NEGATIVE   Ketones, ur NEGATIVE NEGATIVE mg/dL   Protein, ur NEGATIVE NEGATIVE mg/dL   Nitrite NEGATIVE NEGATIVE   Leukocytes, UA NEGATIVE NEGATIVE   RBC / HPF 0-5 0 - 5 RBC/hpf   WBC, UA 11-20 0 - 5 WBC/hpf   Bacteria, UA NONE SEEN NONE SEEN   Squamous Epithelial / LPF NONE SEEN 0 - 5      Assessment & Plan:   Problem List Items Addressed This Visit       Cardiovascular and Mediastinum   Hypertension    Chronic. Not well controlled.  Elevated at visit and elevated when he checks at home.  Will increase Valsartan to 160mg  daily. Will continue with HCTZ 12.5mg  daily.  Follow up in 1 month for reevaluation.      Relevant Medications   valsartan-hydrochlorothiazide (DIOVAN-HCT) 160-12.5 MG tablet   Other Relevant Orders   TSH   PSA   Lipid panel   CBC with Differential/Platelet   Comprehensive metabolic panel   Urinalysis, Routine w reflex microscopic   Other Visit Diagnoses     Annual physical exam    -  Primary   Health maintenance reviewed during visit today. TDAP and shingles given during visit today. Labs ordered today. Cologuard ordered today.    Relevant Orders   TSH   PSA   Lipid panel   CBC with Differential/Platelet   Comprehensive metabolic panel   Urinalysis, Routine w reflex microscopic   Screening for HIV (human immunodeficiency virus)       Relevant Orders   HIV Antibody (routine testing w rflx)   Encounter for hepatitis C screening test for low risk patient       Relevant Orders   Hepatitis C Antibody   Screening for colon cancer       Relevant Orders   Cologuard        Discussed aspirin prophylaxis for myocardial infarction  prevention and decision was it was not indicated  LABORATORY TESTING:  Health maintenance labs ordered today as discussed above.   The natural history of prostate cancer and ongoing controversy regarding screening and potential treatment outcomes of prostate cancer has been discussed with the patient. The meaning of a false positive PSA and a false negative PSA has been discussed. He indicates understanding of the limitations of this screening test and wishes to proceed with screening PSA testing.   IMMUNIZATIONS:   - Tdap: Tetanus vaccination status reviewed: last tetanus booster within 10 years. - Influenza: Refused - Pneumovax: Not applicable - Prevnar: Not applicable - HPV: Not applicable - Zostavax vaccine: Administered today  SCREENING: - Colonoscopy: Ordered today  Discussed with patient purpose of the colonoscopy is to detect colon cancer at curable precancerous or early stages   - AAA Screening: Not applicable  -Hearing Test: Not applicable  -Spirometry: Not applicable   PATIENT COUNSELING:    Sexuality: Discussed sexually transmitted diseases, partner selection, use of condoms, avoidance of unintended pregnancy  and contraceptive alternatives.   Advised to avoid cigarette smoking.  I discussed with the patient that most people either abstain from alcohol or drink within safe limits (<=14/week and <=4 drinks/occasion for males, <=7/weeks and <= 3 drinks/occasion for females) and that the risk for alcohol disorders and other health effects rises proportionally with the number of drinks per week and how often a drinker exceeds daily limits.  Discussed cessation/primary prevention of drug use and availability of treatment for abuse.   Diet: Encouraged to adjust caloric intake  to maintain  or achieve ideal body weight, to reduce intake of dietary saturated fat and total fat, to limit sodium intake by avoiding high sodium foods and not adding table salt, and to maintain adequate  dietary potassium and calcium preferably from fresh fruits, vegetables, and low-fat dairy products.    stressed the importance of regular exercise  Injury prevention: Discussed safety belts, safety helmets, smoke detector, smoking near bedding or upholstery.   Dental health: Discussed importance of regular tooth brushing, flossing, and dental visits.   Follow up plan: NEXT PREVENTATIVE PHYSICAL DUE IN 1 YEAR. Return in about 1 month (around 04/22/2021) for BP Check.

## 2021-03-23 ENCOUNTER — Encounter: Payer: Self-pay | Admitting: Nurse Practitioner

## 2021-03-23 ENCOUNTER — Other Ambulatory Visit: Payer: Self-pay

## 2021-03-23 ENCOUNTER — Ambulatory Visit (INDEPENDENT_AMBULATORY_CARE_PROVIDER_SITE_OTHER): Payer: 59 | Admitting: Nurse Practitioner

## 2021-03-23 VITALS — BP 150/80 | HR 90 | Temp 98.5°F | Ht 67.3 in | Wt 217.6 lb

## 2021-03-23 DIAGNOSIS — Z23 Encounter for immunization: Secondary | ICD-10-CM

## 2021-03-23 DIAGNOSIS — I10 Essential (primary) hypertension: Secondary | ICD-10-CM

## 2021-03-23 DIAGNOSIS — Z0000 Encounter for general adult medical examination without abnormal findings: Secondary | ICD-10-CM | POA: Diagnosis not present

## 2021-03-23 DIAGNOSIS — Z1159 Encounter for screening for other viral diseases: Secondary | ICD-10-CM

## 2021-03-23 DIAGNOSIS — Z1211 Encounter for screening for malignant neoplasm of colon: Secondary | ICD-10-CM

## 2021-03-23 DIAGNOSIS — Z114 Encounter for screening for human immunodeficiency virus [HIV]: Secondary | ICD-10-CM | POA: Diagnosis not present

## 2021-03-23 LAB — URINALYSIS, ROUTINE W REFLEX MICROSCOPIC
Bilirubin, UA: NEGATIVE
Glucose, UA: NEGATIVE
Ketones, UA: NEGATIVE
Leukocytes,UA: NEGATIVE
Nitrite, UA: NEGATIVE
Protein,UA: NEGATIVE
RBC, UA: NEGATIVE
Specific Gravity, UA: 1.015 (ref 1.005–1.030)
Urobilinogen, Ur: 0.2 mg/dL (ref 0.2–1.0)
pH, UA: 6.5 (ref 5.0–7.5)

## 2021-03-23 MED ORDER — VALSARTAN-HYDROCHLOROTHIAZIDE 160-12.5 MG PO TABS: 1.0000 | ORAL_TABLET | Freq: Every day | ORAL | 0 refills | Status: DC

## 2021-03-23 NOTE — Addendum Note (Signed)
Addended by: Pablo Ledger on: 03/23/2021 11:44 AM   Modules accepted: Orders

## 2021-03-23 NOTE — Progress Notes (Signed)
Hi [place]. Your urine from today looks good.  I will send you another message once the rest of your lab work comes back.

## 2021-03-23 NOTE — Assessment & Plan Note (Signed)
Chronic. Not well controlled.  Elevated at visit and elevated when he checks at home.  Will increase Valsartan to 160mg  daily. Will continue with HCTZ 12.5mg  daily.  Follow up in 1 month for reevaluation.

## 2021-03-24 LAB — PSA: Prostate Specific Ag, Serum: 1.6 ng/mL (ref 0.0–4.0)

## 2021-03-24 LAB — CBC WITH DIFFERENTIAL/PLATELET
Basophils Absolute: 0.1 10*3/uL (ref 0.0–0.2)
Basos: 2 %
EOS (ABSOLUTE): 0.1 10*3/uL (ref 0.0–0.4)
Eos: 2 %
Hematocrit: 43.9 % (ref 37.5–51.0)
Hemoglobin: 14 g/dL (ref 13.0–17.7)
Immature Grans (Abs): 0 10*3/uL (ref 0.0–0.1)
Immature Granulocytes: 0 %
Lymphocytes Absolute: 2.1 10*3/uL (ref 0.7–3.1)
Lymphs: 33 %
MCH: 23.9 pg — ABNORMAL LOW (ref 26.6–33.0)
MCHC: 31.9 g/dL (ref 31.5–35.7)
MCV: 75 fL — ABNORMAL LOW (ref 79–97)
Monocytes Absolute: 0.6 10*3/uL (ref 0.1–0.9)
Monocytes: 9 %
Neutrophils Absolute: 3.6 10*3/uL (ref 1.4–7.0)
Neutrophils: 54 %
Platelets: 371 10*3/uL (ref 150–450)
RBC: 5.85 x10E6/uL — ABNORMAL HIGH (ref 4.14–5.80)
RDW: 16.7 % — ABNORMAL HIGH (ref 11.6–15.4)
WBC: 6.6 10*3/uL (ref 3.4–10.8)

## 2021-03-24 LAB — LIPID PANEL
Chol/HDL Ratio: 3.7 ratio (ref 0.0–5.0)
Cholesterol, Total: 181 mg/dL (ref 100–199)
HDL: 49 mg/dL (ref >39)
LDL Chol Calc (NIH): 107 mg/dL — ABNORMAL HIGH (ref 0–99)
Triglycerides: 144 mg/dL (ref 0–149)
VLDL Cholesterol Cal: 25 mg/dL (ref 5–40)

## 2021-03-24 LAB — TSH: TSH: 1.94 u[IU]/mL (ref 0.450–4.500)

## 2021-03-24 LAB — COMPREHENSIVE METABOLIC PANEL
ALT: 34 IU/L (ref 0–44)
AST: 36 IU/L (ref 0–40)
Albumin/Globulin Ratio: 2 (ref 1.2–2.2)
Albumin: 5.4 g/dL — ABNORMAL HIGH (ref 3.8–4.9)
Alkaline Phosphatase: 72 IU/L (ref 44–121)
BUN/Creatinine Ratio: 11 (ref 9–20)
BUN: 11 mg/dL (ref 6–24)
Bilirubin Total: 0.3 mg/dL (ref 0.0–1.2)
CO2: 23 mmol/L (ref 20–29)
Calcium: 10.4 mg/dL — ABNORMAL HIGH (ref 8.7–10.2)
Chloride: 92 mmol/L — ABNORMAL LOW (ref 96–106)
Creatinine, Ser: 1.03 mg/dL (ref 0.76–1.27)
Globulin, Total: 2.7 g/dL (ref 1.5–4.5)
Glucose: 119 mg/dL — ABNORMAL HIGH (ref 65–99)
Potassium: 4.3 mmol/L (ref 3.5–5.2)
Sodium: 134 mmol/L (ref 134–144)
Total Protein: 8.1 g/dL (ref 6.0–8.5)
eGFR: 88 mL/min/{1.73_m2} (ref >59)

## 2021-03-24 LAB — HEPATITIS C ANTIBODY: Hep C Virus Ab: <0.1 s/co ratio (ref 0.0–0.9)

## 2021-03-24 LAB — HIV ANTIBODY (ROUTINE TESTING W REFLEX): HIV Screen 4th Generation wRfx: NONREACTIVE

## 2021-03-24 NOTE — Progress Notes (Signed)
Hi [place]. It was good to see you yesterday.  Your lab work shows that your thyroid, prostate screening, hepatitis c and hiv screening are negative.  Your glucose is elevated.  We should check an A1c to evaluate for diabetes at your next visit. Cholesterol is slightly elevated.  Recommend a low fat diet and exercise.  See you at our next visit.

## 2021-04-15 LAB — COLOGUARD: Cologuard: NEGATIVE

## 2021-04-15 NOTE — Progress Notes (Signed)
Please let patient know that his cologuard test was negative.

## 2021-04-22 ENCOUNTER — Ambulatory Visit: Payer: 59 | Admitting: Nurse Practitioner

## 2021-05-10 ENCOUNTER — Encounter: Payer: Self-pay | Admitting: Nurse Practitioner

## 2021-05-10 ENCOUNTER — Other Ambulatory Visit: Payer: Self-pay

## 2021-05-10 ENCOUNTER — Ambulatory Visit (INDEPENDENT_AMBULATORY_CARE_PROVIDER_SITE_OTHER): Payer: 59 | Admitting: Nurse Practitioner

## 2021-05-10 VITALS — BP 138/82 | HR 95 | Temp 97.7°F | Ht 67.4 in | Wt 223.4 lb

## 2021-05-10 DIAGNOSIS — I10 Essential (primary) hypertension: Secondary | ICD-10-CM | POA: Diagnosis not present

## 2021-05-10 MED ORDER — VALSARTAN-HYDROCHLOROTHIAZIDE 160-12.5 MG PO TABS: 1.0000 | ORAL_TABLET | Freq: Every day | ORAL | 1 refills | Status: DC

## 2021-05-10 MED ORDER — AMLODIPINE BESYLATE 10 MG PO TABS: 10.0000 mg | ORAL_TABLET | Freq: Every day | ORAL | 1 refills | Status: DC

## 2021-05-10 NOTE — Assessment & Plan Note (Signed)
Chronic.  Controlled.  Continue with current medication regimen on Amlodipine 10mg , Valsartan 160mg , and HCTZ 12.5mg . Refills sent today. Return to clinic in 6 months for reevaluation.  Call sooner if concerns arise.

## 2021-05-10 NOTE — Progress Notes (Signed)
BP 138/82   Pulse 95   Temp 97.7 F (36.5 C) (Oral)   Ht 5' 7.4" (1.712 m)   Wt 223 lb 6.4 oz (101.3 kg)   SpO2 98%   BMI 34.58 kg/m    Subjective:    Patient ID: Aaron Richard, male    DOB: 08-Oct-1970, 50 y.o.   MRN: [id number]  HPI: Aaron Richard is a 50 y.o. male  Chief Complaint  Patient presents with   Hypertension   HYPERTENSION Hypertension status: controlled  Satisfied with current treatment? no Duration of hypertension: years BP monitoring frequency:  not checking BP range:  BP medication side effects:  no Medication compliance: excellent compliance Previous BP meds:amlodipine and valsartan-HCTZ Aspirin: no Recurrent headaches: no Visual changes: no Palpitations: no Dyspnea: no Chest pain: no Lower extremity edema: no Dizzy/lightheaded: no  Relevant past medical, surgical, family and social history reviewed and updated as indicated. Interim medical history since our last visit reviewed. Allergies and medications reviewed and updated.  Review of Systems  Eyes:  Negative for visual disturbance.  Respiratory:  Negative for shortness of breath.   Cardiovascular:  Negative for chest pain and leg swelling.  Neurological:  Negative for light-headedness and headaches.   Per HPI unless specifically indicated above     Objective:    BP 138/82   Pulse 95   Temp 97.7 F (36.5 C) (Oral)   Ht 5' 7.4" (1.712 m)   Wt 223 lb 6.4 oz (101.3 kg)   SpO2 98%   BMI 34.58 kg/m   Wt Readings from Last 3 Encounters:  05/10/21 223 lb 6.4 oz (101.3 kg)  03/23/21 217 lb 9.6 oz (98.7 kg)  01/24/21 209 lb 4 oz (94.9 kg)    Physical Exam Vitals and nursing note reviewed.  Constitutional:      General: He is not in acute distress.    Appearance: Normal appearance. He is not ill-appearing, toxic-appearing or diaphoretic.  HENT:     Head: Normocephalic.     Right Ear: External ear normal.     Left Ear: External ear normal.     Nose: Nose normal. No congestion or  rhinorrhea.     Mouth/Throat:     Mouth: Mucous membranes are moist.  Eyes:     General:        Right eye: No discharge.        Left eye: No discharge.     Extraocular Movements: Extraocular movements intact.     Conjunctiva/sclera: Conjunctivae normal.     Pupils: Pupils are equal, round, and reactive to light.  Cardiovascular:     Rate and Rhythm: Normal rate and regular rhythm.     Heart sounds: No murmur heard. Pulmonary:     Effort: Pulmonary effort is normal. No respiratory distress.     Breath sounds: Normal breath sounds. No wheezing, rhonchi or rales.  Abdominal:     General: Abdomen is flat. Bowel sounds are normal.  Musculoskeletal:     Cervical back: Normal range of motion and neck supple.  Skin:    General: Skin is warm and dry.     Capillary Refill: Capillary refill takes less than 2 seconds.  Neurological:     General: No focal deficit present.     Mental Status: He is alert and oriented to person, place, and time.  Psychiatric:        Mood and Affect: Mood normal.        Behavior: Behavior normal.  Thought Content: Thought content normal.        Judgment: Judgment normal.    Results for orders placed or performed in visit on 03/23/21  TSH  Result Value Ref Range   TSH 1.940 0.450 - 4.500 uIU/mL  PSA  Result Value Ref Range   Prostate Specific Ag, Serum 1.6 0.0 - 4.0 ng/mL  Lipid panel  Result Value Ref Range   Cholesterol, Total 181 100 - 199 mg/dL   Triglycerides 144 0 - 149 mg/dL   HDL 49 >39 mg/dL   VLDL Cholesterol Cal 25 5 - 40 mg/dL   LDL Chol Calc (NIH) 107 (H) 0 - 99 mg/dL   Chol/HDL Ratio 3.7 0.0 - 5.0 ratio  CBC with Differential/Platelet  Result Value Ref Range   WBC 6.6 3.4 - 10.8 x10E3/uL   RBC 5.85 (H) 4.14 - 5.80 x10E6/uL   Hemoglobin 14.0 13.0 - 17.7 g/dL   Hematocrit 43.9 37.5 - 51.0 %   MCV 75 (L) 79 - 97 fL   MCH 23.9 (L) 26.6 - 33.0 pg   MCHC 31.9 31.5 - 35.7 g/dL   RDW 16.7 (H) 11.6 - 15.4 %   Platelets 371 150 - 450  x10E3/uL   Neutrophils 54 Not Estab. %   Lymphs 33 Not Estab. %   Monocytes 9 Not Estab. %   Eos 2 Not Estab. %   Basos 2 Not Estab. %   Neutrophils Absolute 3.6 1.4 - 7.0 x10E3/uL   Lymphocytes Absolute 2.1 0.7 - 3.1 x10E3/uL   Monocytes Absolute 0.6 0.1 - 0.9 x10E3/uL   EOS (ABSOLUTE) 0.1 0.0 - 0.4 x10E3/uL   Basophils Absolute 0.1 0.0 - 0.2 x10E3/uL   Immature Granulocytes 0 Not Estab. %   Immature Grans (Abs) 0.0 0.0 - 0.1 x10E3/uL  Comprehensive metabolic panel  Result Value Ref Range   Glucose 119 (H) 65 - 99 mg/dL   BUN 11 6 - 24 mg/dL   Creatinine, Ser 1.03 0.76 - 1.27 mg/dL   eGFR 88 >59 mL/min/1.73   BUN/Creatinine Ratio 11 9 - 20   Sodium 134 134 - 144 mmol/L   Potassium 4.3 3.5 - 5.2 mmol/L   Chloride 92 (L) 96 - 106 mmol/L   CO2 23 20 - 29 mmol/L   Calcium 10.4 (H) 8.7 - 10.2 mg/dL   Total Protein 8.1 6.0 - 8.5 g/dL   Albumin 5.4 (H) 3.8 - 4.9 g/dL   Globulin, Total 2.7 1.5 - 4.5 g/dL   Albumin/Globulin Ratio 2.0 1.2 - 2.2   Bilirubin Total 0.3 0.0 - 1.2 mg/dL   Alkaline Phosphatase 72 44 - 121 IU/L   AST 36 0 - 40 IU/L   ALT 34 0 - 44 IU/L  Urinalysis, Routine w reflex microscopic  Result Value Ref Range   Specific Gravity, UA 1.015 1.005 - 1.030   pH, UA 6.5 5.0 - 7.5   Color, UA Yellow Yellow   Appearance Ur Clear Clear   Leukocytes,UA Negative Negative   Protein,UA Negative Negative/Trace   Glucose, UA Negative Negative   Ketones, UA Negative Negative   RBC, UA Negative Negative   Bilirubin, UA Negative Negative   Urobilinogen, Ur 0.2 0.2 - 1.0 mg/dL   Nitrite, UA Negative Negative  Hepatitis C Antibody  Result Value Ref Range   Hep C Virus Ab <0.1 0.0 - 0.9 s/co ratio  HIV Antibody (routine testing w rflx)  Result Value Ref Range   HIV Screen 4th Generation wRfx Non Reactive Non Reactive  Cologuard  Result Value Ref Range   Cologuard Negative Negative      Assessment & Plan:   Problem List Items Addressed This Visit       Cardiovascular  and Mediastinum   Hypertension - Primary    Chronic.  Controlled.  Continue with current medication regimen on Amlodipine 57m, Valsartan 161m and HCTZ 12.60m39mRefills sent today. Return to clinic in 6 months for reevaluation.  Call sooner if concerns arise.        Relevant Medications   amLODipine (NORVASC) 10 MG tablet   valsartan-hydrochlorothiazide (DIOVAN-HCT) 160-12.5 MG tablet     Follow up plan: Return in about 6 months (around 11/07/2021) for HTN, HLD, DM2 FU.

## 2021-06-10 ENCOUNTER — Ambulatory Visit: Payer: Self-pay | Admitting: *Deleted

## 2021-06-10 NOTE — Telephone Encounter (Signed)
Reason for Disposition  Swollen joint of new-onset  Answer Assessment - Initial Assessment Questions 1. ONSET: "When did the pain start?"     Pt calling in c/o right hand being swollen.   Started last night with swelling.  The pain started 2 days ago.   It hurt so bad I could not sleep.    No accidents or injuries to right hand.  My job is dragging metal around. 2. LOCATION: "Where is the pain located?"     Right hand on top of my hand hurts to even touch it.    I can't hardly use my right hand. 3. PAIN: "How bad is the pain?" (Scale 1-10; or mild, moderate, severe)   - MILD (1-3): doesn't interfere with normal activities   - MODERATE (4-7): interferes with normal activities (e.g., work or school) or awakens from sleep   - SEVERE (8-10): excruciating pain, unable to use hand at all     9/10 4. WORK OR EXERCISE: "Has there been any recent work or exercise that involved this part (i.e., hand or wrist) of the body?"     No accidents or injuries.    5. CAUSE: "What do you think is causing the pain?"     I don't know.    I have gout in my feet maybe it's in my hand too.   6. AGGRAVATING FACTORS: "What makes the pain worse?" (e.g., using computer)     Not asked 7. OTHER SYMPTOMS: "Do you have any other symptoms?" (e.g., neck pain, swelling, rash, numbness, fever)     It was numb a little last night but the feeling came back.   I have a brace on.   I used ibuprofen this morning.    Heat and ice did not help.  8. PREGNANCY: "Is there any chance you are pregnant?" "When was your last menstrual period?"     N/A  Protocols used: Hand and Wrist Pain-A-AH

## 2021-06-10 NOTE — Telephone Encounter (Signed)
Pt called in c/o right hand being painful and swollen.   The pain started 2 days ago but the swelling started last night and is worse this morning.   He is wondering if he has gout in his hand because he has it in his feet and has these same symptoms.      There are no appts with [facility] today so I suggested he go to the urgent care which he is agreeable to doing.   He is at work now but is going to go on to the urgent care this morning.

## 2021-06-30 ENCOUNTER — Other Ambulatory Visit: Payer: Self-pay

## 2021-06-30 ENCOUNTER — Ambulatory Visit (INDEPENDENT_AMBULATORY_CARE_PROVIDER_SITE_OTHER): Payer: 59 | Admitting: Nurse Practitioner

## 2021-06-30 ENCOUNTER — Encounter: Payer: Self-pay | Admitting: Nurse Practitioner

## 2021-06-30 VITALS — BP 159/102 | HR 89 | Temp 98.6°F | Ht 67.4 in | Wt 223.4 lb

## 2021-06-30 DIAGNOSIS — M25531 Pain in right wrist: Secondary | ICD-10-CM

## 2021-06-30 DIAGNOSIS — D72829 Elevated white blood cell count, unspecified: Secondary | ICD-10-CM

## 2021-06-30 MED ORDER — PREDNISONE 10 MG PO TABS: ORAL_TABLET | ORAL | 0 refills | Status: DC

## 2021-06-30 NOTE — Assessment & Plan Note (Signed)
?   Tendonitis vs gout. Will treat with prednisone taper. Check x-ray of right wrist, uric acid, and cbc. Exercises printed for patient. Follow up in 4 weeks or sooner with concerns.

## 2021-06-30 NOTE — Progress Notes (Signed)
Acute Office Visit  Subjective:    Patient ID: Aaron Richard, male    DOB: 12-12-1970, 49 y.o.   MRN: [id number]  Chief Complaint  Patient presents with   Hand Pain    Both for the past month, Patient states that he cant even hold a bottle of water   Right wrist pain    HPI Patient is in today for right hand and wrist pain and swelling for the past month.  WRIST PAIN   Duration: weeks Involved wrist: right Mechanism of injury:  no trauma Location: medial and posterior Onset: gradual Severity: moderate  Quality:  sharp, sore Frequency: constant Radiation: yes up right arm Aggravating factors: movement  Alleviating factors: ice, NSAIDs, brace, and rest  Status: worse Treatments attempted: rest, ice, heat, and ibuprofen    Relief with NSAIDs?:  mild Weakness: yes Numbness: no  Redness: yes Bruising: no Swelling: yes Fevers: no   Past Medical History:  Diagnosis Date   Hypertension     No past surgical history on file.  Family History  Problem Relation Age of Onset   Hypertension Mother     Social History   Socioeconomic History   Marital status: Single    Spouse name: Not on file   Number of children: Not on file   Years of education: Not on file   Highest education level: Not on file  Occupational History   Not on file  Tobacco Use   Smoking status: Never   Smokeless tobacco: Never  Vaping Use   Vaping Use: Never used  Substance and Sexual Activity   Alcohol use: Yes    Comment: Occasionally   Drug use: Not Currently   Sexual activity: Yes    Birth control/protection: None  Other Topics Concern   Not on file  Social History Narrative   Not on file   Social Determinants of Health   Financial Resource Strain: Not on file  Food Insecurity: Not on file  Transportation Needs: Not on file  Physical Activity: Not on file  Stress: Not on file  Social Connections: Not on file  Intimate Partner Violence: Not on file    Outpatient  Medications Prior to Visit  Medication Sig Dispense Refill   amLODipine (NORVASC) 10 MG tablet Take 1 tablet (10 mg total) by mouth daily. 90 tablet 1   valsartan-hydrochlorothiazide (DIOVAN-HCT) 160-12.5 MG tablet Take 1 tablet by mouth daily. 90 tablet 1   No facility-administered medications prior to visit.    No Known Allergies  Review of Systems  Constitutional: Negative.   Respiratory: Negative.    Cardiovascular: Negative.   Musculoskeletal:  Positive for arthralgias (right wrist pain and swelling).  Neurological: Negative.       Objective:    Physical Exam Vitals and nursing note reviewed.  Constitutional:      Appearance: Normal appearance.  HENT:     Head: Normocephalic.  Eyes:     Conjunctiva/sclera: Conjunctivae normal.  Cardiovascular:     Pulses: Normal pulses.  Pulmonary:     Effort: Pulmonary effort is normal.  Musculoskeletal:        General: Swelling and tenderness present.     Cervical back: Normal range of motion.     Comments: Right wrist swollen and tender to touch. Positive finklestein's test. Limited ROM due to pain  Skin:    General: Skin is warm.  Neurological:     General: No focal deficit present.     Mental Status: He is  alert and oriented to person, place, and time.  Psychiatric:        Mood and Affect: Mood normal.        Behavior: Behavior normal.        Thought Content: Thought content normal.        Judgment: Judgment normal.    BP (!) 159/102 Comment: Did not take meds today   Pulse 89    Temp 98.6 F (37 C) (Oral)    Ht 5' 7.4" (1.712 m)    Wt 223 lb 6.4 oz (101.3 kg)    SpO2 98%    BMI 34.57 kg/m  Wt Readings from Last 3 Encounters:  06/30/21 223 lb 6.4 oz (101.3 kg)  05/10/21 223 lb 6.4 oz (101.3 kg)  03/23/21 217 lb 9.6 oz (98.7 kg)    There are no preventive care reminders to display for this patient.  There are no preventive care reminders to display for this patient.   Lab Results  Component Value Date   TSH  1.940 03/23/2021   Lab Results  Component Value Date   WBC 6.6 03/23/2021   HGB 14.0 03/23/2021   HCT 43.9 03/23/2021   MCV 75 (L) 03/23/2021   PLT 371 03/23/2021   Lab Results  Component Value Date   NA 134 03/23/2021   K 4.3 03/23/2021   CO2 23 03/23/2021   GLUCOSE 119 (H) 03/23/2021   BUN 11 03/23/2021   CREATININE 1.03 03/23/2021   BILITOT 0.3 03/23/2021   ALKPHOS 72 03/23/2021   AST 36 03/23/2021   ALT 34 03/23/2021   PROT 8.1 03/23/2021   ALBUMIN 5.4 (H) 03/23/2021   CALCIUM 10.4 (H) 03/23/2021   EGFR 88 03/23/2021   Lab Results  Component Value Date   CHOL 181 03/23/2021   Lab Results  Component Value Date   HDL 49 03/23/2021   Lab Results  Component Value Date   LDLCALC 107 (H) 03/23/2021   Lab Results  Component Value Date   TRIG 144 03/23/2021   Lab Results  Component Value Date   CHOLHDL 3.7 03/23/2021   No results found for: HGBA1C     Assessment & Plan:   Problem List Items Addressed This Visit       Other   Right wrist pain - Primary    ? Tendonitis vs gout. Will treat with prednisone taper. Check x-ray of right wrist, uric acid, and cbc. Exercises printed for patient. Follow up in 4 weeks or sooner with concerns.       Relevant Orders   DG Wrist Complete Right   Uric acid   CBC with Differential     Meds ordered this encounter  Medications   predniSONE (DELTASONE) 10 MG tablet    Sig: Take 6 tablets today, then 5 tablets tomorrow, then decrease by 1 tablet every day until gone    Dispense:  21 tablet    Refill:  0     Charyl Dancer, NP

## 2021-07-01 ENCOUNTER — Other Ambulatory Visit: Payer: 59

## 2021-07-01 DIAGNOSIS — M25531 Pain in right wrist: Secondary | ICD-10-CM

## 2021-07-01 DIAGNOSIS — D72829 Elevated white blood cell count, unspecified: Secondary | ICD-10-CM

## 2021-07-01 LAB — CBC WITH DIFFERENTIAL/PLATELET
Basophils Absolute: 0.1 10*3/uL (ref 0.0–0.2)
Basos: 1 %
EOS (ABSOLUTE): 0.2 10*3/uL (ref 0.0–0.4)
Eos: 2 %
Hematocrit: 38.8 % (ref 37.5–51.0)
Hematocrit: 37.2 % — ABNORMAL LOW (ref 37.5–51.0)
Hemoglobin: 12.6 g/dL — ABNORMAL LOW (ref 13.0–17.7)
Hemoglobin: 12.5 g/dL — ABNORMAL LOW (ref 13.0–17.7)
Immature Grans (Abs): 0 10*3/uL (ref 0.0–0.1)
Immature Granulocytes: 0 %
Lymphocytes Absolute: 2.7 10*3/uL (ref 0.7–3.1)
Lymphocytes Absolute: 1.6 10*3/uL (ref 0.7–3.1)
Lymphs: 22 %
Lymphs: 15 %
MCH: 23.4 pg — ABNORMAL LOW (ref 26.6–33.0)
MCH: 23.9 pg — ABNORMAL LOW (ref 26.6–33.0)
MCHC: 32.5 g/dL (ref 31.5–35.7)
MCHC: 33.6 g/dL (ref 31.5–35.7)
MCV: 72 fL — ABNORMAL LOW (ref 79–97)
MCV: 71 fL — ABNORMAL LOW (ref 79–97)
MID (Absolute): 0.3 10*3/uL (ref 0.1–1.6)
MID: 3 %
Monocytes Absolute: 1.2 10*3/uL — ABNORMAL HIGH (ref 0.1–0.9)
Monocytes: 9 %
Neutrophils Absolute: 8.1 10*3/uL — ABNORMAL HIGH (ref 1.4–7.0)
Neutrophils Absolute: 8.7 10*3/uL — ABNORMAL HIGH (ref 1.4–7.0)
Neutrophils: 66 %
Neutrophils: 82 %
Platelets: 514 10*3/uL — ABNORMAL HIGH (ref 150–450)
Platelets: 567 10*3/uL — ABNORMAL HIGH (ref 150–450)
RBC: 5.39 x10E6/uL (ref 4.14–5.80)
RBC: 5.23 x10E6/uL (ref 4.14–5.80)
RDW: 15.1 % (ref 11.6–15.4)
RDW: 16.5 % — ABNORMAL HIGH (ref 11.6–15.4)
WBC: 12.3 10*3/uL — ABNORMAL HIGH (ref 3.4–10.8)
WBC: 10.6 10*3/uL (ref 3.4–10.8)

## 2021-07-01 LAB — URIC ACID: Uric Acid: 5.9 mg/dL (ref 3.8–8.4)

## 2021-07-01 NOTE — Addendum Note (Signed)
Addended by: Rodman Pickle A on: 07/01/2021 08:00 AM   Modules accepted: Orders

## 2021-07-05 ENCOUNTER — Telehealth: Payer: Self-pay | Admitting: Nurse Practitioner

## 2021-07-05 NOTE — Telephone Encounter (Signed)
Please let him know that his white blood cell count went back down to normal which is what was elevated on the first test. His uric acid level is normal, but it still could be gout causing his symptoms. He still has the order in for the x-ray which he can get at the [facility] office Monday-Thursday.

## 2021-07-05 NOTE — Telephone Encounter (Signed)
Copied from CRM [id number]. Topic: General - Inquiry >> Jul 01, 2021  2:53 PM Daphine Deutscher D wrote: Reason for CRM: {t called regarding his lab work.    CB#  [phone]

## 2021-07-06 NOTE — Telephone Encounter (Signed)
Patient aware of results, no further questions. Address for [facility] given to patient.

## 2021-07-27 ENCOUNTER — Ambulatory Visit
Admission: RE | Admit: 2021-07-27 | Discharge: 2021-07-27 | Disposition: A | Discharge status: Home / Self Care | Payer: 59 | Source: Ambulatory Visit | Attending: Nurse Practitioner | Admitting: Nurse Practitioner

## 2021-07-27 ENCOUNTER — Other Ambulatory Visit: Payer: Self-pay

## 2021-07-27 ENCOUNTER — Ambulatory Visit
Admission: RE | Admit: 2021-07-27 | Discharge: 2021-07-27 | Disposition: A | Discharge status: Home / Self Care | Payer: 59 | Attending: Nurse Practitioner | Admitting: Nurse Practitioner

## 2021-07-27 DIAGNOSIS — M25531 Pain in right wrist: Secondary | ICD-10-CM

## 2021-07-27 NOTE — Progress Notes (Signed)
BP 134/80    Pulse 96    Temp 98.5 F (36.9 C) (Oral)    Wt 215 lb 3.2 oz (97.6 kg)    SpO2 98%    BMI 33.30 kg/m    Subjective:    Patient ID: Aaron Richard, male    DOB: 09-07-1970, 51 y.o.   MRN: [id number]  HPI: Aaron Richard is a 51 y.o. male  Chief Complaint  Patient presents with   Wrist Pain    4 week f/up for R wrist pain- states he is still having pain and currently rating it at an 8    Patient is here today to follow up wrist pain.  He was treated with prednisone. He states the swelling went down but is still having throbbing pains in his forearm. Rates it at 8/10 today.  The steroids didn't help the pain.    Relevant past medical, surgical, family and social history reviewed and updated as indicated. Interim medical history since our last visit reviewed. Allergies and medications reviewed and updated.  Review of Systems  Musculoskeletal:        Right wrist pain   Per HPI unless specifically indicated above     Objective:    BP 134/80    Pulse 96    Temp 98.5 F (36.9 C) (Oral)    Wt 215 lb 3.2 oz (97.6 kg)    SpO2 98%    BMI 33.30 kg/m   Wt Readings from Last 3 Encounters:  07/28/21 215 lb 3.2 oz (97.6 kg)  06/30/21 223 lb 6.4 oz (101.3 kg)  05/10/21 223 lb 6.4 oz (101.3 kg)    Physical Exam Vitals and nursing note reviewed.  Constitutional:      General: He is not in acute distress.    Appearance: Normal appearance. He is not ill-appearing, toxic-appearing or diaphoretic.  HENT:     Head: Normocephalic.     Right Ear: External ear normal.     Left Ear: External ear normal.     Nose: Nose normal. No congestion or rhinorrhea.     Mouth/Throat:     Mouth: Mucous membranes are moist.  Eyes:     General:        Right eye: No discharge.        Left eye: No discharge.     Extraocular Movements: Extraocular movements intact.     Conjunctiva/sclera: Conjunctivae normal.     Pupils: Pupils are equal, round, and reactive to light.  Cardiovascular:      Rate and Rhythm: Normal rate and regular rhythm.     Heart sounds: No murmur heard. Pulmonary:     Effort: Pulmonary effort is normal. No respiratory distress.     Breath sounds: Normal breath sounds. No wheezing, rhonchi or rales.  Abdominal:     General: Abdomen is flat. Bowel sounds are normal.  Musculoskeletal:     Cervical back: Normal range of motion and neck supple.  Skin:    General: Skin is warm and dry.     Capillary Refill: Capillary refill takes less than 2 seconds.  Neurological:     General: No focal deficit present.     Mental Status: He is alert and oriented to person, place, and time.  Psychiatric:        Mood and Affect: Mood normal.        Behavior: Behavior normal.        Thought Content: Thought content normal.  Judgment: Judgment normal.    Results for orders placed or performed in visit on 07/01/21  CBC With Differential/Platelet  Result Value Ref Range   WBC 10.6 3.4 - 10.8 x10E3/uL   RBC 5.23 4.14 - 5.80 x10E6/uL   Hemoglobin 12.5 (L) 13.0 - 17.7 g/dL   Hematocrit 37.2 (L) 37.5 - 51.0 %   MCV 71 (L) 79 - 97 fL   MCH 23.9 (L) 26.6 - 33.0 pg   MCHC 33.6 31.5 - 35.7 g/dL   RDW 16.5 (H) 11.6 - 15.4 %   Platelets 567 (H) 150 - 450 x10E3/uL   Neutrophils 82 Not Estab. %   Lymphs 15 Not Estab. %   MID 3 Not Estab. %   Neutrophils Absolute 8.7 (H) 1.4 - 7.0 x10E3/uL   Lymphocytes Absolute 1.6 0.7 - 3.1 x10E3/uL   MID (Absolute) 0.3 0.1 - 1.6 X10E3/uL      Assessment & Plan:   Problem List Items Addressed This Visit       Other   Right wrist pain - Primary    Prednisone taper helped with the swelling but not pain. Xray unremarkable. Discussed results with patient during visit.  Will send patient to be evaluated by orthopedics.       Relevant Orders   Ambulatory referral to Orthopedics     Follow up plan: Return if symptoms worsen or fail to improve.

## 2021-07-28 ENCOUNTER — Encounter: Payer: Self-pay | Admitting: Nurse Practitioner

## 2021-07-28 ENCOUNTER — Ambulatory Visit (INDEPENDENT_AMBULATORY_CARE_PROVIDER_SITE_OTHER): Payer: 59 | Admitting: Nurse Practitioner

## 2021-07-28 VITALS — BP 134/80 | HR 96 | Temp 98.5°F | Wt 215.2 lb

## 2021-07-28 DIAGNOSIS — M25531 Pain in right wrist: Secondary | ICD-10-CM | POA: Diagnosis not present

## 2021-07-28 NOTE — Patient Instructions (Signed)
[address], [place], [place] [address]- [facility]

## 2021-07-28 NOTE — Progress Notes (Signed)
Hi [place]. Your xray of your wrist was normal.  If your symptoms persist we should have you see Orthopedics. Please let me know if you would like me to place that referral.

## 2021-07-28 NOTE — Assessment & Plan Note (Addendum)
Prednisone taper helped with the swelling but not pain. Xray unremarkable. Discussed results with patient during visit.  Will send patient to be evaluated by orthopedics.

## 2021-10-29 IMAGING — CR DG ANKLE COMPLETE 3+V*L*
3 series · 3 of 3 positions shown · non-contrast
Comparison: None.

CLINICAL DATA: Left ankle pain since [REDACTED] morning, atraumatic

EXAM:
LEFT ANKLE COMPLETE - 3+ VIEW

[ankle ap]
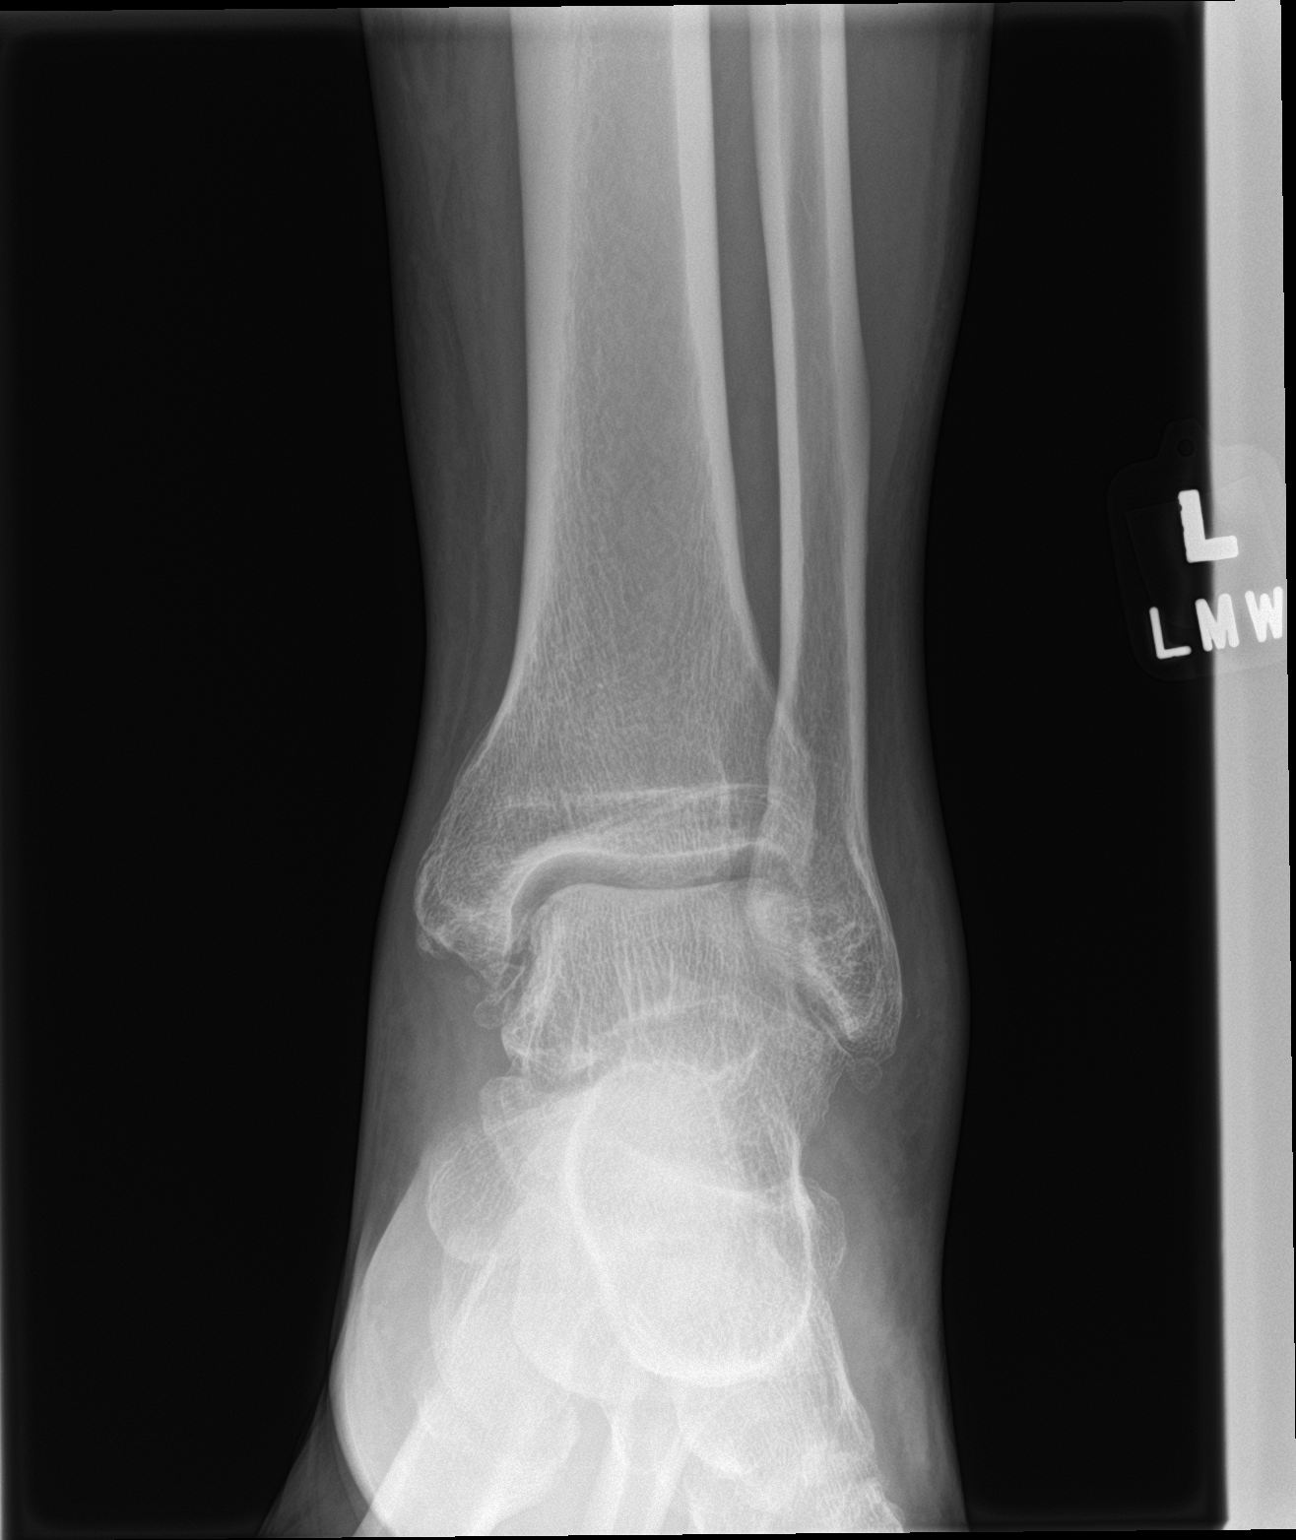

[ankle obl]
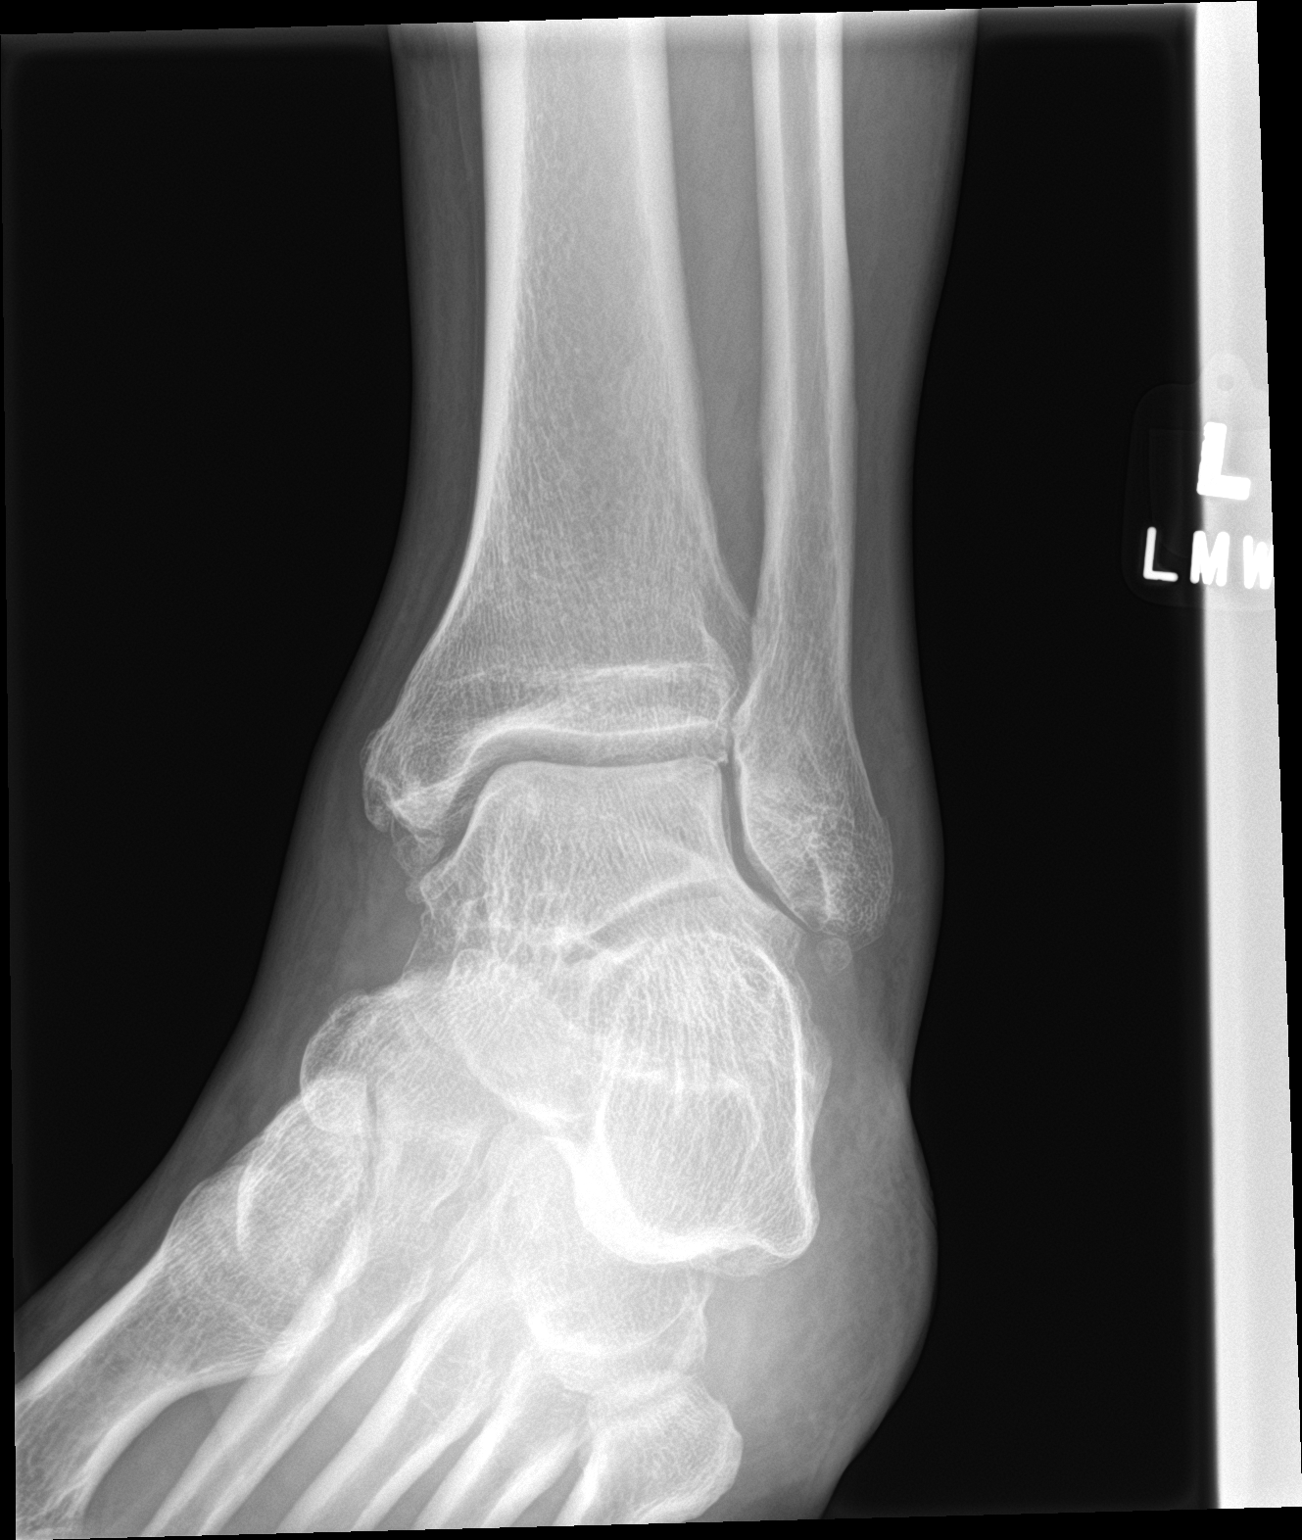

[ankle lat]
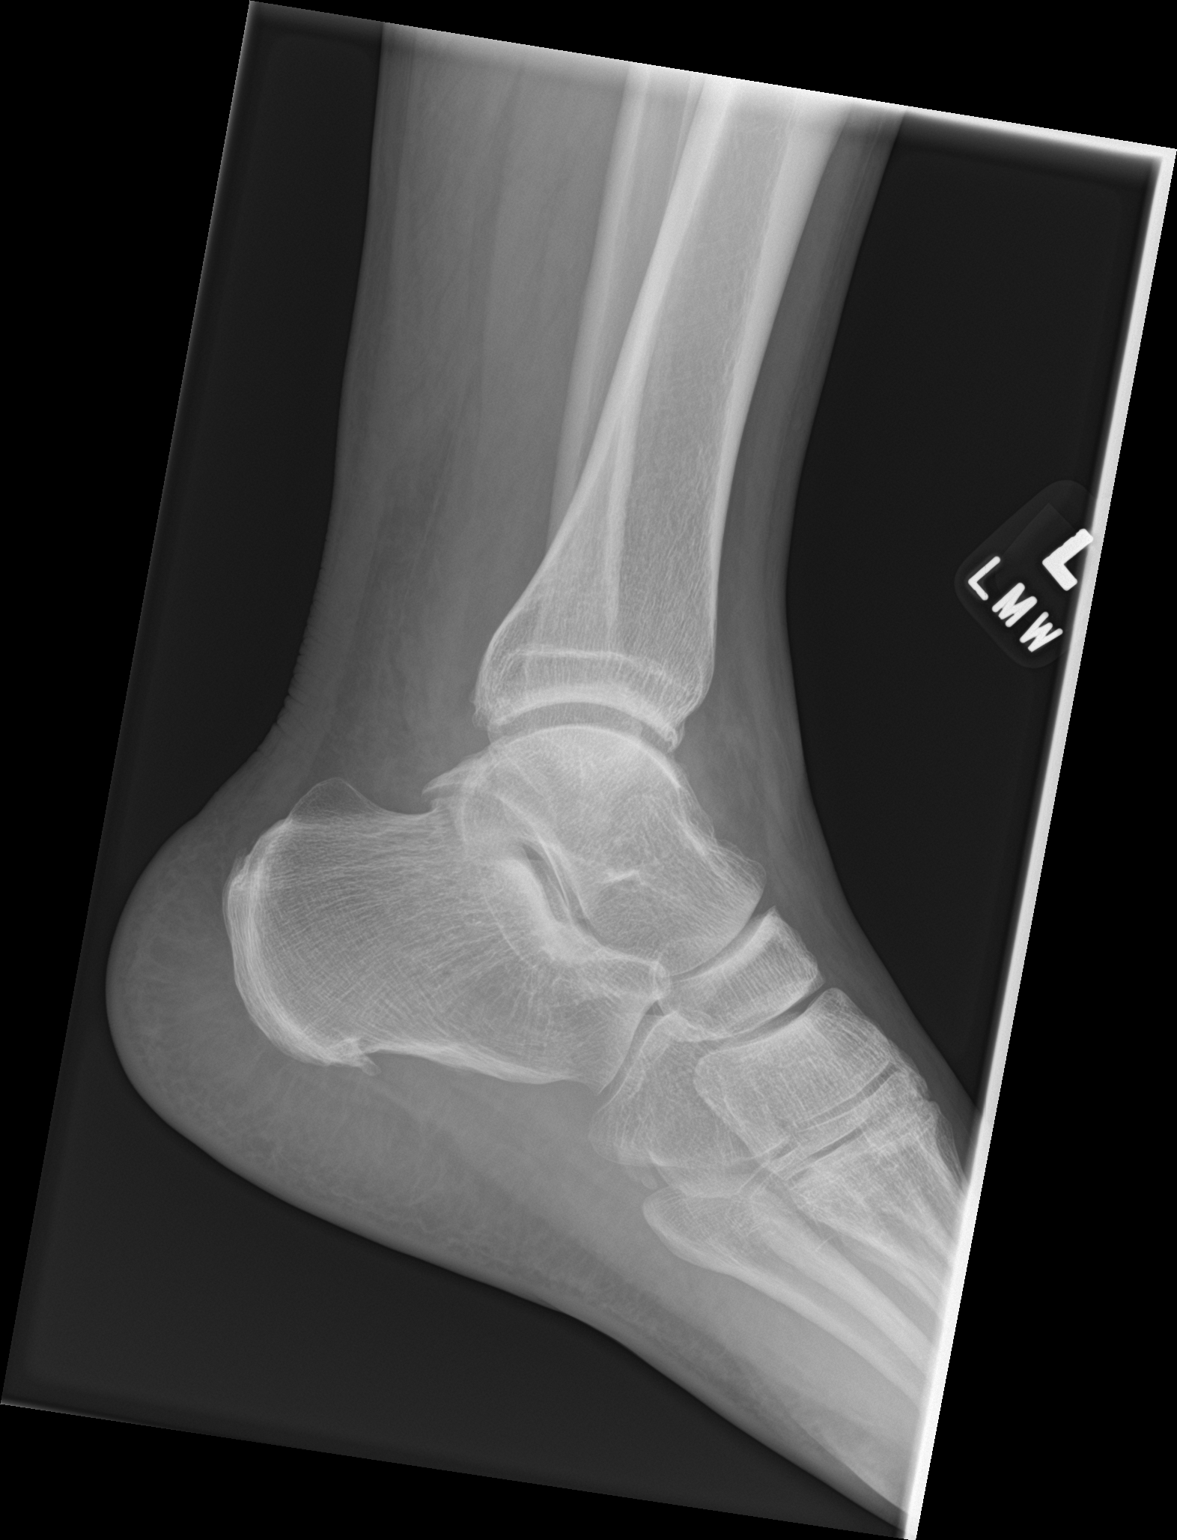

[3 of 3 positions shown; findings below may reference images not displayed]

FINDINGS: Soft tissue swelling without fracture, subluxation, or erosion.
Ossicles below both medial and lateral malleoli. Mild spurring at
the plantar heel and tibiotalar joint. Borderline for ankle joint
effusion. No erosion or soft tissue calcification.
IMPRESSION: Soft tissue swelling without acute osseous finding.

## 2021-11-07 ENCOUNTER — Encounter: Payer: Self-pay | Admitting: Nurse Practitioner

## 2021-11-07 ENCOUNTER — Ambulatory Visit (INDEPENDENT_AMBULATORY_CARE_PROVIDER_SITE_OTHER): Payer: 59 | Admitting: Nurse Practitioner

## 2021-11-07 VITALS — BP 147/95 | HR 97 | Temp 98.7°F | Wt 190.6 lb

## 2021-11-07 DIAGNOSIS — I10 Essential (primary) hypertension: Secondary | ICD-10-CM

## 2021-11-07 DIAGNOSIS — E7800 Pure hypercholesterolemia, unspecified: Secondary | ICD-10-CM

## 2021-11-07 DIAGNOSIS — E118 Type 2 diabetes mellitus with unspecified complications: Secondary | ICD-10-CM | POA: Insufficient documentation

## 2021-11-07 DIAGNOSIS — R7309 Other abnormal glucose: Secondary | ICD-10-CM

## 2021-11-07 LAB — BAYER DCA HB A1C WAIVED: HB A1C (BAYER DCA - WAIVED): >14 % — ABNORMAL HIGH (ref 4.8–5.6)

## 2021-11-07 MED ORDER — DAPAGLIFLOZIN PROPANEDIOL 10 MG PO TABS: 10.0000 mg | ORAL_TABLET | Freq: Every day | ORAL | 1 refills | Status: DC

## 2021-11-07 MED ORDER — VALSARTAN 80 MG PO TABS: 80.0000 mg | ORAL_TABLET | Freq: Every day | ORAL | 1 refills | Status: DC

## 2021-11-07 MED ORDER — METFORMIN HCL 500 MG PO TABS: 500.0000 mg | ORAL_TABLET | Freq: Two times a day (BID) | ORAL | 1 refills | Status: DC

## 2021-11-07 MED ORDER — AMLODIPINE BESYLATE 10 MG PO TABS
10.0000 mg | ORAL_TABLET | Freq: Every day | ORAL | 1 refills | Status: AC
Start: 2021-11-07 — End: 2022-11-07

## 2021-11-07 NOTE — Assessment & Plan Note (Signed)
Chronic. Labs ordered today.  Will likely start statin at next visit as patient is newly diagnosed with diabetes.  Do not want to overwhelm patient at visit.  Will follow up in 1 month for reevaluation.  ?

## 2021-11-07 NOTE — Assessment & Plan Note (Signed)
Chronic.  Not well controlled.  Will add Valsartan 80mg  daily.  Side effects and benefits of medication discussed with patient during visit.  Labs ordered today.  Will make recommendations based on lab results.  ?

## 2021-11-07 NOTE — Progress Notes (Signed)
? ?BP (!) 147/95   Pulse 97   Temp 98.7 ?F (37.1 ?C) (Oral)   Wt 190 lb 9.6 oz (86.5 kg)   SpO2 97%   BMI 29.50 kg/m?   ? ?Subjective:  ? ? Patient ID: Aaron Richard, male    DOB: [birth year], 51 y.o.   MRN: [id number] ? ?HPI: ?Aaron Richard is a 51 y.o. male ? ?Chief Complaint  ?Patient presents with  ? Hyperlipidemia  ? Hypertension  ? Diabetes  ? ?HYPERTENSION / HYPERLIPIDEMIA ?Satisfied with current treatment? yes ?Duration of hypertension: years ?BP monitoring frequency: not checking ?BP range:  ?BP medication side effects: no ?Past BP meds: amlodipine ?Duration of hyperlipidemia: years ?Cholesterol medication side effects: no ?Cholesterol supplements: none ?Past cholesterol medications: none ?Medication compliance: excellent compliance ?Aspirin: no ?Recent stressors: no ?Recurrent headaches: no ?Visual changes: no ?Palpitations: no ?Dyspnea: no ?Chest pain: no ?Lower extremity edema: no ?Dizzy/lightheaded: no ? ?Patient states he is losing weight and can't quench his thirst.  Patient's mom is diabetic.  Patient states he is also having to urinate a lot.   ? ?Relevant past medical, surgical, family and social history reviewed and updated as indicated. Interim medical history since our last visit reviewed. ?Allergies and medications reviewed and updated. ? ?Review of Systems  ?Constitutional:  Positive for unexpected weight change.  ?Eyes:  Negative for visual disturbance.  ?Respiratory:  Negative for chest tightness and shortness of breath.   ?Cardiovascular:  Negative for chest pain, palpitations and leg swelling.  ?Endocrine: Positive for polyphagia and polyuria.  ?Neurological:  Negative for dizziness, light-headedness and headaches.  ? ?Per HPI unless specifically indicated above ? ?   ?Objective:  ?  ?BP (!) 147/95   Pulse 97   Temp 98.7 ?F (37.1 ?C) (Oral)   Wt 190 lb 9.6 oz (86.5 kg)   SpO2 97%   BMI 29.50 kg/m?   ?Wt Readings from Last 3 Encounters:  ?11/07/21 190 lb 9.6 oz (86.5 kg)  ?07/28/21  215 lb 3.2 oz (97.6 kg)  ?06/30/21 223 lb 6.4 oz (101.3 kg)  ?  ?Physical Exam ?Vitals and nursing note reviewed.  ?Constitutional:   ?   General: He is not in acute distress. ?   Appearance: Normal appearance. He is not ill-appearing, toxic-appearing or diaphoretic.  ?HENT:  ?   Head: Normocephalic.  ?   Right Ear: External ear normal.  ?   Left Ear: External ear normal.  ?   Nose: Nose normal. No congestion or rhinorrhea.  ?   Mouth/Throat:  ?   Mouth: Mucous membranes are moist.  ?Eyes:  ?   General:     ?   Right eye: No discharge.     ?   Left eye: No discharge.  ?   Extraocular Movements: Extraocular movements intact.  ?   Conjunctiva/sclera: Conjunctivae normal.  ?   Pupils: Pupils are equal, round, and reactive to light.  ?Cardiovascular:  ?   Rate and Rhythm: Normal rate and regular rhythm.  ?   Heart sounds: No murmur heard. ?Pulmonary:  ?   Effort: Pulmonary effort is normal. No respiratory distress.  ?   Breath sounds: Normal breath sounds. No wheezing, rhonchi or rales.  ?Abdominal:  ?   General: Abdomen is flat. Bowel sounds are normal.  ?Musculoskeletal:  ?   Cervical back: Normal range of motion and neck supple.  ?Skin: ?   General: Skin is warm and dry.  ?   Capillary Refill: Capillary  refill takes less than 2 seconds.  ?Neurological:  ?   General: No focal deficit present.  ?   Mental Status: He is alert and oriented to person, place, and time.  ?Psychiatric:     ?   Mood and Affect: Mood normal.     ?   Behavior: Behavior normal.     ?   Thought Content: Thought content normal.     ?   Judgment: Judgment normal.  ? ? ?Results for orders placed or performed in visit on 07/01/21  ?CBC With Differential/Platelet  ?Result Value Ref Range  ? WBC 10.6 3.4 - 10.8 x10E3/uL  ? RBC 5.23 4.14 - 5.80 x10E6/uL  ? Hemoglobin 12.5 (L) 13.0 - 17.7 g/dL  ? Hematocrit 37.2 (L) 37.5 - 51.0 %  ? MCV 71 (L) 79 - 97 fL  ? MCH 23.9 (L) 26.6 - 33.0 pg  ? MCHC 33.6 31.5 - 35.7 g/dL  ? RDW 16.5 (H) 11.6 - 15.4 %  ?  Platelets 567 (H) 150 - 450 x10E3/uL  ? Neutrophils 82 Not Estab. %  ? Lymphs 15 Not Estab. %  ? MID 3 Not Estab. %  ? Neutrophils Absolute 8.7 (H) 1.4 - 7.0 x10E3/uL  ? Lymphocytes Absolute 1.6 0.7 - 3.1 x10E3/uL  ? MID (Absolute) 0.3 0.1 - 1.6 X10E3/uL  ? ?   ?Assessment & Plan:  ? ?Problem List Items Addressed This Visit   ? ?  ? Cardiovascular and Mediastinum  ? Hypertension  ?  Chronic.  Not well controlled.  Will add Valsartan 55m daily.  Side effects and benefits of medication discussed with patient during visit.  Labs ordered today.  Will make recommendations based on lab results.  ? ?  ?  ? Relevant Medications  ? amLODipine (NORVASC) 10 MG tablet  ? valsartan (DIOVAN) 80 MG tablet  ? Other Relevant Orders  ? Comp Met (CMET)  ?  ? Endocrine  ? Controlled type 2 diabetes mellitus with complication, without long-term current use of insulin ([facility]  ?  New diagnosis today.  A1c 14% in office today.  Will start Metformin 5047mdaily and increase to BID in two weeks.  [facility] [place] be to increase to 100037mID as patient tolerates medication.  Will also add Farxiga for diabetes control.  Side effects and benefits of medication discussed during visit.  Will follow up in 1 month for reevaluation. Discussed diabetic diet and significantly decreasing carbohydrate increase.  Can refer for diabetic education at next visit if patient is still losing weight and symptoms have not improved.  ? ?  ?  ? Relevant Medications  ? metFORMIN (GLUCOPHAGE) 500 MG tablet  ? dapagliflozin propanediol (FARXIGA) 10 MG TABS tablet  ? valsartan (DIOVAN) 80 MG tablet  ?  ? Other  ? Elevated LDL cholesterol level  ?  Chronic. Labs ordered today.  Will likely start statin at next visit as patient is newly diagnosed with diabetes.  Do not want to overwhelm patient at visit.  Will follow up in 1 month for reevaluation.  ? ?  ?  ? Relevant Orders  ? Lipid Profile  ? ?Other Visit Diagnoses   ? ? Elevated glucose      ? Relevant Orders  ?  Bayer DCA Hb A1c Waived (STAT)  ? ?  ?  ? ?Follow up plan: ?Return in about 1 month (around 12/08/2021) for BP Check and glucose check. ? ? ? ? ? ?

## 2021-11-07 NOTE — Progress Notes (Signed)
Results discussed with patient during visit today.

## 2021-11-07 NOTE — Assessment & Plan Note (Addendum)
New diagnosis today.  A1c 14% in office today.  Will start Metformin 500mg  daily and increase to BID in two weeks.  will be to increase to 1000mg  BID as patient tolerates medication.  Will also add Farxiga for diabetes control.  Side effects and benefits of medication discussed during visit.  Will follow up in 1 month for reevaluation. Discussed diabetic diet and significantly decreasing carbohydrate increase.  Can refer for diabetic education at next visit if patient is still losing weight and symptoms have not improved.  ?

## 2021-11-08 ENCOUNTER — Telehealth: Payer: Self-pay | Admitting: *Deleted

## 2021-11-08 NOTE — Addendum Note (Signed)
Addended by: Larae Grooms on: 11/08/2021 08:15 AM ? ? Modules accepted: Orders ? ?

## 2021-11-08 NOTE — Telephone Encounter (Signed)
[facility] report ? ?Glucose 604 ?

## 2021-11-08 NOTE — Telephone Encounter (Signed)
Provider not currently in office. Placed call to provider to report critical lab. Per Santiago Glad, please call the patient and notify the patient of the results. Also instruct him to watch out for headaches, blurred vision, trouble concentrating, dizziness, and confusion. Any signs of these symptoms, he should be seen at the ER.  ?

## 2021-11-08 NOTE — Progress Notes (Signed)
Please let patient know that his lab work shows that his cholesterol is significantly elevated from prior.  I strongly encourage him to see the diabetic education so he can work on diet to help control the diabetes and cholesterol.  If he agrees, I will place the referral and we will get him started. I recommend he avoid Gatorade or other sugary drinks and focus on drinking water to quench his thirst.   ? ?I would like him to come back next week and recheck his sodium level.  It is low but likely due to the diabetes.  Please make him a lab appt.

## 2021-11-08 NOTE — Telephone Encounter (Signed)
Called and notified patient of lab results and instructions from provider. Patient verbalized understanding.  ?

## 2021-11-09 LAB — COMPREHENSIVE METABOLIC PANEL
ALT: 14 IU/L (ref 0–44)
AST: 10 IU/L (ref 0–40)
Albumin/Globulin Ratio: 1.6 (ref 1.2–2.2)
Albumin: 4.6 g/dL (ref 3.8–4.9)
Alkaline Phosphatase: 144 IU/L — ABNORMAL HIGH (ref 44–121)
BUN/Creatinine Ratio: 14 (ref 9–20)
BUN: 14 mg/dL (ref 6–24)
Bilirubin Total: <0.2 mg/dL (ref 0.0–1.2)
CO2: 17 mmol/L — ABNORMAL LOW (ref 20–29)
Calcium: 9.5 mg/dL (ref 8.7–10.2)
Chloride: 90 mmol/L — ABNORMAL LOW (ref 96–106)
Creatinine, Ser: 0.97 mg/dL (ref 0.76–1.27)
Globulin, Total: 2.8 g/dL (ref 1.5–4.5)
Glucose: 604 mg/dL (ref 70–99)
Potassium: 5 mmol/L (ref 3.5–5.2)
Sodium: 125 mmol/L — ABNORMAL LOW (ref 134–144)
Total Protein: 7.4 g/dL (ref 6.0–8.5)
eGFR: 95 mL/min/{1.73_m2} (ref >59)

## 2021-11-09 LAB — LIPID PANEL
Chol/HDL Ratio: 7.7 ratio — ABNORMAL HIGH (ref 0.0–5.0)
Cholesterol, Total: 261 mg/dL — ABNORMAL HIGH (ref 100–199)
HDL: 34 mg/dL — ABNORMAL LOW (ref >39)
Triglycerides: 1273 mg/dL (ref 0–149)

## 2021-12-07 NOTE — Progress Notes (Unsigned)
There were no vitals taken for this visit.   Subjective:    Patient ID: Aaron Richard, male    DOB: 1970/09/21, 51 y.o.   MRN: [id number]  HPI: Aaron L Humber is a 51 y.o. male  No chief complaint on file.  HYPERTENSION / HYPERLIPIDEMIA Satisfied with current treatment? yes Duration of hypertension: years BP monitoring frequency: not checking BP range:  BP medication side effects: no Past BP meds: amlodipine Duration of hyperlipidemia: years Cholesterol medication side effects: no Cholesterol supplements: none Past cholesterol medications: none Medication compliance: excellent compliance Aspirin: no Recent stressors: no Recurrent headaches: no Visual changes: no Palpitations: no Dyspnea: no Chest pain: no Lower extremity edema: no Dizzy/lightheaded: no  Patient states he is losing weight and can't quench his thirst.  Patient's mom is diabetic.  Patient states he is also having to urinate a lot.    Relevant past medical, surgical, family and social history reviewed and updated as indicated. Interim medical history since our last visit reviewed. Allergies and medications reviewed and updated.  Review of Systems  Constitutional:  Positive for unexpected weight change.  Eyes:  Negative for visual disturbance.  Respiratory:  Negative for chest tightness and shortness of breath.   Cardiovascular:  Negative for chest pain, palpitations and leg swelling.  Endocrine: Positive for polyphagia and polyuria.  Neurological:  Negative for dizziness, light-headedness and headaches.   Per HPI unless specifically indicated above     Objective:    There were no vitals taken for this visit.  Wt Readings from Last 3 Encounters:  11/07/21 190 lb 9.6 oz (86.5 kg)  07/28/21 215 lb 3.2 oz (97.6 kg)  06/30/21 223 lb 6.4 oz (101.3 kg)    Physical Exam Vitals and nursing note reviewed.  Constitutional:      General: He is not in acute distress.    Appearance: Normal appearance. He  is not ill-appearing, toxic-appearing or diaphoretic.  HENT:     Head: Normocephalic.     Right Ear: External ear normal.     Left Ear: External ear normal.     Nose: Nose normal. No congestion or rhinorrhea.     Mouth/Throat:     Mouth: Mucous membranes are moist.  Eyes:     General:        Right eye: No discharge.        Left eye: No discharge.     Extraocular Movements: Extraocular movements intact.     Conjunctiva/sclera: Conjunctivae normal.     Pupils: Pupils are equal, round, and reactive to light.  Cardiovascular:     Rate and Rhythm: Normal rate and regular rhythm.     Heart sounds: No murmur heard. Pulmonary:     Effort: Pulmonary effort is normal. No respiratory distress.     Breath sounds: Normal breath sounds. No wheezing, rhonchi or rales.  Abdominal:     General: Abdomen is flat. Bowel sounds are normal.  Musculoskeletal:     Cervical back: Normal range of motion and neck supple.  Skin:    General: Skin is warm and dry.     Capillary Refill: Capillary refill takes less than 2 seconds.  Neurological:     General: No focal deficit present.     Mental Status: He is alert and oriented to person, place, and time.  Psychiatric:        Mood and Affect: Mood normal.        Behavior: Behavior normal.  Thought Content: Thought content normal.        Judgment: Judgment normal.    Results for orders placed or performed in visit on 11/07/21  Comp Met (CMET)  Result Value Ref Range   Glucose 604 (HH) 70 - 99 mg/dL   BUN 14 6 - 24 mg/dL   Creatinine, Ser 0.97 0.76 - 1.27 mg/dL   eGFR 95 >59 mL/min/1.73   BUN/Creatinine Ratio 14 9 - 20   Sodium 125 (L) 134 - 144 mmol/L   Potassium 5.0 3.5 - 5.2 mmol/L   Chloride 90 (L) 96 - 106 mmol/L   CO2 17 (L) 20 - 29 mmol/L   Calcium 9.5 8.7 - 10.2 mg/dL   Total Protein 7.4 6.0 - 8.5 g/dL   Albumin 4.6 3.8 - 4.9 g/dL   Globulin, Total 2.8 1.5 - 4.5 g/dL   Albumin/Globulin Ratio 1.6 1.2 - 2.2   Bilirubin Total <0.2 0.0  - 1.2 mg/dL   Alkaline Phosphatase 144 (H) 44 - 121 IU/L   AST 10 0 - 40 IU/L   ALT 14 0 - 44 IU/L  Lipid Profile  Result Value Ref Range   Cholesterol, Total 261 (H) 100 - 199 mg/dL   Triglycerides 1,273 (HH) 0 - 149 mg/dL   HDL 34 (L) >39 mg/dL   VLDL Cholesterol Cal Comment (A) 5 - 40 mg/dL   LDL Chol Calc (NIH) Comment (A) 0 - 99 mg/dL   Chol/HDL Ratio 7.7 (H) 0.0 - 5.0 ratio  Bayer DCA Hb A1c Waived (STAT)  Result Value Ref Range   HB A1C (BAYER DCA - WAIVED) >14.0 (H) 4.8 - 5.6 %      Assessment & Plan:   Problem List Items Addressed This Visit      Cardiovascular and Mediastinum   Hypertension - Primary     Endocrine   Controlled type 2 diabetes mellitus with complication, without long-term current use of insulin (HCC)     Follow up plan: No follow-ups on file.

## 2021-12-08 ENCOUNTER — Telehealth: Payer: Self-pay | Admitting: Nurse Practitioner

## 2021-12-08 ENCOUNTER — Encounter: Payer: Self-pay | Admitting: Nurse Practitioner

## 2021-12-08 ENCOUNTER — Ambulatory Visit (INDEPENDENT_AMBULATORY_CARE_PROVIDER_SITE_OTHER): Payer: 59 | Admitting: Nurse Practitioner

## 2021-12-08 VITALS — BP 140/90 | HR 90 | Temp 98.1°F | Wt 198.0 lb

## 2021-12-08 DIAGNOSIS — E118 Type 2 diabetes mellitus with unspecified complications: Secondary | ICD-10-CM

## 2021-12-08 DIAGNOSIS — I10 Essential (primary) hypertension: Secondary | ICD-10-CM | POA: Diagnosis not present

## 2021-12-08 MED ORDER — VALSARTAN 160 MG PO TABS: 160.0000 mg | ORAL_TABLET | Freq: Every day | ORAL | 1 refills | Status: DC

## 2021-12-08 MED ORDER — SITAGLIPTIN PHOSPHATE 25 MG PO TABS: 25.0000 mg | ORAL_TABLET | Freq: Every day | ORAL | 1 refills | Status: AC

## 2021-12-08 NOTE — Assessment & Plan Note (Addendum)
Chronic. New diagnosis last visit.  No longer in a Catabolic state. Has gained weight since last visit. Denies polyuria and polydipsia.  Patient states the metformin was expensive but he is taking the [place].  Called the pharmacy and believe patient is actually taking metformin and not [place].  Patient is going to call the office back and let me know which one he is taking.  Will add Januvia to patient's regimen.  Follow up in 2 months.  Will recheck A1c at that time.

## 2021-12-08 NOTE — Telephone Encounter (Signed)
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 Will await message.

## 2021-12-08 NOTE — Telephone Encounter (Signed)
Please call the patient and verify what he is taking for the diabetic medications.  Pharmacy says he picked up the metformin.

## 2021-12-08 NOTE — Telephone Encounter (Signed)
Attempted to call patient to verify what meds he is taking for diabetes. Pharmacy told me the [facility] was on hold for him. Will try again later. Unable to LVM due to inbox being full.

## 2021-12-13 ENCOUNTER — Ambulatory Visit: Payer: 59 | Admitting: Physician Assistant

## 2022-02-06 NOTE — Progress Notes (Unsigned)
There were no vitals taken for this visit.   Subjective:    Patient ID: Aaron Richard, male    DOB: 08-19-1970, 51 y.o.   MRN: [id number]  HPI: Aaron Richard is a 51 y.o. male  No chief complaint on file.  HYPERTENSION / HYPERLIPIDEMIA Satisfied with current treatment? yes Duration of hypertension: years BP monitoring frequency: not checking BP range:  BP medication side effects: no Past BP meds: amlodipine Duration of hyperlipidemia: years Cholesterol medication side effects: no Cholesterol supplements: none Past cholesterol medications: none Medication compliance: excellent compliance Aspirin: no Recent stressors: no Recurrent headaches: no Visual changes: no Palpitations: no Dyspnea: no Chest pain: no Lower extremity edema: no Dizzy/lightheaded: no  DIABETES Hypoglycemic episodes:{Blank single:19197::"yes","no"} Polydipsia/polyuria: {Blank single:19197::"yes","no"} Visual disturbance: {Blank single:19197::"yes","no"} Chest pain: {Blank single:19197::"yes","no"} Paresthesias: {Blank single:19197::"yes","no"} Glucose Monitoring: {Blank single:19197::"yes","no"}  Accucheck frequency: {Blank single:19197::"Not Checking","Daily","BID","TID"}  Fasting glucose:  Post prandial:  Evening:  Before meals: Taking Insulin?: {Blank single:19197::"yes","no"}  Long acting insulin:  Short acting insulin: Blood Pressure Monitoring: {Blank single:19197::"not checking","rarely","daily","weekly","monthly","a few times a day","a few times a week","a few times a month"} Retinal Examination: {Blank single:19197::"Up to Date","Not up to Date"} Foot Exam: {Blank single:19197::"Up to Date","Not up to Date"} Diabetic Education: {Blank single:19197::"Completed","Not Completed"} Pneumovax: {Blank single:19197::"Up to Date","Not up to Date","unknown"} Influenza: {Blank single:19197::"Up to Date","Not up to Date","unknown"} Aspirin: {Blank single:19197::"yes","no"}   Relevant past  medical, surgical, family and social history reviewed and updated as indicated. Interim medical history since our last visit reviewed. Allergies and medications reviewed and updated.  Review of Systems  Constitutional:  Positive for unexpected weight change.  Eyes:  Negative for visual disturbance.  Respiratory:  Negative for chest tightness and shortness of breath.   Cardiovascular:  Negative for chest pain, palpitations and leg swelling.  Endocrine: Positive for polyphagia and polyuria.  Neurological:  Negative for dizziness, light-headedness and headaches.    Per HPI unless specifically indicated above     Objective:    There were no vitals taken for this visit.  Wt Readings from Last 3 Encounters:  12/08/21 198 lb (89.8 kg)  11/07/21 190 lb 9.6 oz (86.5 kg)  07/28/21 215 lb 3.2 oz (97.6 kg)    Physical Exam Vitals and nursing note reviewed.  Constitutional:      General: He is not in acute distress.    Appearance: Normal appearance. He is not ill-appearing, toxic-appearing or diaphoretic.  HENT:     Head: Normocephalic.     Right Ear: External ear normal.     Left Ear: External ear normal.     Nose: Nose normal. No congestion or rhinorrhea.     Mouth/Throat:     Mouth: Mucous membranes are moist.  Eyes:     General:        Right eye: No discharge.        Left eye: No discharge.     Extraocular Movements: Extraocular movements intact.     Conjunctiva/sclera: Conjunctivae normal.     Pupils: Pupils are equal, round, and reactive to light.  Cardiovascular:     Rate and Rhythm: Normal rate and regular rhythm.     Heart sounds: No murmur heard. Pulmonary:     Effort: Pulmonary effort is normal. No respiratory distress.     Breath sounds: Normal breath sounds. No wheezing, rhonchi or rales.  Abdominal:     General: Abdomen is flat. Bowel sounds are normal.  Musculoskeletal:     Cervical back: Normal range of motion and neck  supple.  Skin:    General: Skin is warm  and dry.     Capillary Refill: Capillary refill takes less than 2 seconds.  Neurological:     General: No focal deficit present.     Mental Status: He is alert and oriented to person, place, and time.  Psychiatric:        Mood and Affect: Mood normal.        Behavior: Behavior normal.        Thought Content: Thought content normal.        Judgment: Judgment normal.     Results for orders placed or performed in visit on 11/07/21  Comp Met (CMET)  Result Value Ref Range   Glucose 604 (HH) 70 - 99 mg/dL   BUN 14 6 - 24 mg/dL   Creatinine, Ser 0.97 0.76 - 1.27 mg/dL   eGFR 95 >59 mL/min/1.73   BUN/Creatinine Ratio 14 9 - 20   Sodium 125 (L) 134 - 144 mmol/L   Potassium 5.0 3.5 - 5.2 mmol/L   Chloride 90 (L) 96 - 106 mmol/L   CO2 17 (L) 20 - 29 mmol/L   Calcium 9.5 8.7 - 10.2 mg/dL   Total Protein 7.4 6.0 - 8.5 g/dL   Albumin 4.6 3.8 - 4.9 g/dL   Globulin, Total 2.8 1.5 - 4.5 g/dL   Albumin/Globulin Ratio 1.6 1.2 - 2.2   Bilirubin Total <0.2 0.0 - 1.2 mg/dL   Alkaline Phosphatase 144 (H) 44 - 121 IU/L   AST 10 0 - 40 IU/L   ALT 14 0 - 44 IU/L  Lipid Profile  Result Value Ref Range   Cholesterol, Total 261 (H) 100 - 199 mg/dL   Triglycerides 1,273 (HH) 0 - 149 mg/dL   HDL 34 (L) >39 mg/dL   VLDL Cholesterol Cal Comment (A) 5 - 40 mg/dL   LDL Chol Calc (NIH) Comment (A) 0 - 99 mg/dL   Chol/HDL Ratio 7.7 (H) 0.0 - 5.0 ratio  Bayer DCA Hb A1c Waived (STAT)  Result Value Ref Range   HB A1C (BAYER DCA - WAIVED) >14.0 (H) 4.8 - 5.6 %      Assessment & Plan:   Problem List Items Addressed This Visit       Cardiovascular and Mediastinum   Hypertension - Primary     Endocrine   Controlled type 2 diabetes mellitus with complication, without long-term current use of insulin (HCC)     Other   Elevated LDL cholesterol level     Follow up plan: No follow-ups on file.

## 2022-02-07 ENCOUNTER — Encounter: Payer: Self-pay | Admitting: Nurse Practitioner

## 2022-02-07 ENCOUNTER — Ambulatory Visit: Payer: 59 | Admitting: Nurse Practitioner

## 2022-02-07 VITALS — BP 153/98 | HR 87 | Temp 98.7°F | Wt 203.6 lb

## 2022-02-07 DIAGNOSIS — I10 Essential (primary) hypertension: Secondary | ICD-10-CM | POA: Diagnosis not present

## 2022-02-07 DIAGNOSIS — E118 Type 2 diabetes mellitus with unspecified complications: Secondary | ICD-10-CM

## 2022-02-07 DIAGNOSIS — E7800 Pure hypercholesterolemia, unspecified: Secondary | ICD-10-CM

## 2022-02-07 MED ORDER — VALSARTAN 320 MG PO TABS
320.0000 mg | ORAL_TABLET | Freq: Every day | ORAL | 1 refills | Status: AC
Start: 2022-02-07 — End: ?

## 2022-02-07 MED ORDER — METFORMIN HCL 500 MG PO TABS: 1000.0000 mg | ORAL_TABLET | Freq: Two times a day (BID) | ORAL | 1 refills | Status: DC

## 2022-02-07 NOTE — Assessment & Plan Note (Signed)
Chronic. Not well controlled.  Will increase Valsartan to 320mg . Recommend checking blood pressure at home.  Follow up in 1 month.  Call sooner if concerns arise.

## 2022-02-07 NOTE — Assessment & Plan Note (Signed)
Chronic.  Last A1c >14.  Has changed his diet and taking Metformin.  Increased Metformin to 1000mg  BID. Labs ordered today. Will make recommendations based on lab results.

## 2022-02-07 NOTE — Assessment & Plan Note (Signed)
Labs ordered today.  Will make recommendations based on lab results. ?

## 2022-02-08 LAB — COMPREHENSIVE METABOLIC PANEL
ALT: 17 IU/L (ref 0–44)
AST: 18 IU/L (ref 0–40)
Albumin/Globulin Ratio: 1.7 (ref 1.2–2.2)
Albumin: 4.7 g/dL (ref 3.8–4.9)
Alkaline Phosphatase: 62 IU/L (ref 44–121)
BUN/Creatinine Ratio: 11 (ref 9–20)
BUN: 10 mg/dL (ref 6–24)
Bilirubin Total: 0.2 mg/dL (ref 0.0–1.2)
CO2: 22 mmol/L (ref 20–29)
Calcium: 10.2 mg/dL (ref 8.7–10.2)
Chloride: 100 mmol/L (ref 96–106)
Creatinine, Ser: 0.87 mg/dL (ref 0.76–1.27)
Globulin, Total: 2.7 g/dL (ref 1.5–4.5)
Glucose: 99 mg/dL (ref 70–99)
Potassium: 4.6 mmol/L (ref 3.5–5.2)
Sodium: 138 mmol/L (ref 134–144)
Total Protein: 7.4 g/dL (ref 6.0–8.5)
eGFR: 104 mL/min/{1.73_m2} (ref >59)

## 2022-02-08 LAB — LIPID PANEL
Chol/HDL Ratio: 2.2 ratio (ref 0.0–5.0)
Cholesterol, Total: 157 mg/dL (ref 100–199)
HDL: 72 mg/dL (ref >39)
LDL Chol Calc (NIH): 68 mg/dL (ref 0–99)
Triglycerides: 96 mg/dL (ref 0–149)
VLDL Cholesterol Cal: 17 mg/dL (ref 5–40)

## 2022-02-08 LAB — HEMOGLOBIN A1C
Est. average glucose Bld gHb Est-mCnc: 157 mg/dL
Hgb A1c MFr Bld: 7.1 % — ABNORMAL HIGH (ref 4.8–5.6)

## 2022-02-08 NOTE — Progress Notes (Signed)
Please let patient know that his lab work has significantly improved.  A1c went from greater than 14 to 7.1.  Increase the Metformin as we discussed.  Continue with the diet changes he has made.  Cholesterol has also significantly improved.  No other concerns at this time.  Follow up as discussed.

## 2022-02-15 ENCOUNTER — Telehealth: Payer: Self-pay

## 2022-02-15 MED ORDER — METFORMIN HCL 500 MG PO TABS
1000.0000 mg | ORAL_TABLET | Freq: Two times a day (BID) | ORAL | 1 refills | Status: AC
Start: 2022-02-15 — End: ?

## 2022-02-15 NOTE — Telephone Encounter (Signed)
Medication sent to the pharmacy.

## 2022-02-15 NOTE — Telephone Encounter (Signed)
[facility] sent in fax stating clarification on metformin 500 MG. States there are 2 sets of directions, would like to know if it is A) 2 BID or B) 1 AM x 14 days 1 BID  Please advise.

## 2022-03-06 NOTE — Progress Notes (Unsigned)
   There were no vitals taken for this visit.   Subjective:    Patient ID: Aaron Richard, male    DOB: Nov 11, 1970, 51 y.o.   MRN: [id number]  HPI: Aaron L Wettstein is a 51 y.o. male  No chief complaint on file.  HYPERTENSION Hypertension status: {Blank single:19197::"controlled","uncontrolled","better","worse","exacerbated","stable"}  Satisfied with current treatment? {Blank single:19197::"yes","no"} Duration of hypertension: {Blank single:19197::"chronic","months","years"} BP monitoring frequency:  {Blank single:19197::"not checking","rarely","daily","weekly","monthly","a few times a day","a few times a week","a few times a month"} BP range:  BP medication side effects:  {Blank single:19197::"yes","no"} Medication compliance: {Blank single:19197::"excellent compliance","good compliance","fair compliance","poor compliance"} Previous BP meds:{Blank [id number]:45859::"YTWK","MQKMMNOTRR","NHAFBXUXYB/FXOVANVBTY","OMAYOKHT","XHFSFSELTR","VUYEBXIDHW/YSHU","OHFGBMSXJD (bystolic)","carvedilol","chlorthalidone","clonidine","diltiazem","exforge HCT","HCTZ","irbesartan (avapro)","labetalol","lisinopril","lisinopril-HCTZ","losartan (cozaar)","methyldopa","nifedipine","olmesartan (benicar)","olmesartan-HCTZ","quinapril","ramipril","spironalactone","tekturna","valsartan","valsartan-HCTZ","verapamil"} Aspirin: {Blank single:19197::"yes","no"} Recurrent headaches: {Blank single:19197::"yes","no"} Visual changes: {Blank single:19197::"yes","no"} Palpitations: {Blank single:19197::"yes","no"} Dyspnea: {Blank single:19197::"yes","no"} Chest pain: {Blank single:19197::"yes","no"} Lower extremity edema: {Blank single:19197::"yes","no"} Dizzy/lightheaded: {Blank single:19197::"yes","no"}  Relevant past medical, surgical, family and social history reviewed and updated as indicated. Interim medical history since our last visit reviewed. Allergies and medications reviewed and updated.  Review of Systems  Per HPI  unless specifically indicated above     Objective:    There were no vitals taken for this visit.  Wt Readings from Last 3 Encounters:  02/07/22 203 lb 9.6 oz (92.4 kg)  12/08/21 198 lb (89.8 kg)  11/07/21 190 lb 9.6 oz (86.5 kg)    Physical Exam  Results for orders placed or performed in visit on 02/07/22  HgB A1c  Result Value Ref Range   Hgb A1c MFr Bld 7.1 (H) 4.8 - 5.6 %   Est. average glucose Bld gHb Est-mCnc 157 mg/dL  Lipid Profile  Result Value Ref Range   Cholesterol, Total 157 100 - 199 mg/dL   Triglycerides 96 0 - 149 mg/dL   HDL 72 >39 mg/dL   VLDL Cholesterol Cal 17 5 - 40 mg/dL   LDL Chol Calc (NIH) 68 0 - 99 mg/dL   Chol/HDL Ratio 2.2 0.0 - 5.0 ratio  Comp Met (CMET)  Result Value Ref Range   Glucose 99 70 - 99 mg/dL   BUN 10 6 - 24 mg/dL   Creatinine, Ser 0.87 0.76 - 1.27 mg/dL   eGFR 104 >59 mL/min/1.73   BUN/Creatinine Ratio 11 9 - 20   Sodium 138 134 - 144 mmol/L   Potassium 4.6 3.5 - 5.2 mmol/L   Chloride 100 96 - 106 mmol/L   CO2 22 20 - 29 mmol/L   Calcium 10.2 8.7 - 10.2 mg/dL   Total Protein 7.4 6.0 - 8.5 g/dL   Albumin 4.7 3.8 - 4.9 g/dL   Globulin, Total 2.7 1.5 - 4.5 g/dL   Albumin/Globulin Ratio 1.7 1.2 - 2.2   Bilirubin Total 0.2 0.0 - 1.2 mg/dL   Alkaline Phosphatase 62 44 - 121 IU/L   AST 18 0 - 40 IU/L   ALT 17 0 - 44 IU/L      Assessment & Plan:   Problem List Items Addressed This Visit   None    Follow up plan: No follow-ups on file.

## 2022-03-07 ENCOUNTER — Ambulatory Visit (INDEPENDENT_AMBULATORY_CARE_PROVIDER_SITE_OTHER): Payer: 59 | Admitting: Nurse Practitioner

## 2022-03-07 DIAGNOSIS — Z538 Procedure and treatment not carried out for other reasons: Secondary | ICD-10-CM

## 2022-03-07 DIAGNOSIS — I10 Essential (primary) hypertension: Secondary | ICD-10-CM

## 2022-03-08 NOTE — Progress Notes (Signed)
Patient did not show for appt

## 2022-03-10 ENCOUNTER — Ambulatory Visit: Payer: 59 | Admitting: Nurse Practitioner

## 2022-08-30 ENCOUNTER — Other Ambulatory Visit: Payer: Self-pay | Admitting: Nurse Practitioner
